# Patient Record
Sex: Female | Born: 1960 | Race: White | Hispanic: No | Marital: Married | Smoking: Never smoker
Health system: Southern US, Community
[De-identification: ages and names within clinical notes are randomized; demographics above are authoritative.]

## PROBLEM LIST (undated history)

## (undated) ENCOUNTER — Encounter

## (undated) ENCOUNTER — Ambulatory Visit

## (undated) ENCOUNTER — Telehealth

## (undated) ENCOUNTER — Encounter
Attending: Student in an Organized Health Care Education/Training Program | Primary: Student in an Organized Health Care Education/Training Program

## (undated) ENCOUNTER — Non-Acute Institutional Stay: Payer: BLUE CROSS/BLUE SHIELD | Attending: Dermatology | Primary: Dermatology

## (undated) ENCOUNTER — Ambulatory Visit: Payer: BLUE CROSS/BLUE SHIELD

## (undated) DIAGNOSIS — L409 Psoriasis, unspecified: Secondary | ICD-10-CM

## (undated) DIAGNOSIS — U071 COVID-19: Secondary | ICD-10-CM

## (undated) DIAGNOSIS — T7840XA Allergy, unspecified, initial encounter: Secondary | ICD-10-CM

## (undated) DIAGNOSIS — N6009 Solitary cyst of unspecified breast: Secondary | ICD-10-CM

## (undated) DIAGNOSIS — N390 Urinary tract infection, site not specified: Secondary | ICD-10-CM

## (undated) DIAGNOSIS — F419 Anxiety disorder, unspecified: Secondary | ICD-10-CM

## (undated) DIAGNOSIS — F32A Depression, unspecified: Secondary | ICD-10-CM

## (undated) DIAGNOSIS — F329 Major depressive disorder, single episode, unspecified: Secondary | ICD-10-CM

## (undated) HISTORY — DX: Psoriasis, unspecified: L40.9

## (undated) HISTORY — DX: Urinary tract infection, site not specified: N39.0

## (undated) HISTORY — PX: BREAST BIOPSY: SHX20

## (undated) HISTORY — DX: Anxiety disorder, unspecified: F41.9

## (undated) HISTORY — DX: Allergy, unspecified, initial encounter: T78.40XA

## (undated) HISTORY — DX: Solitary cyst of unspecified breast: N60.09

## (undated) HISTORY — DX: Depression, unspecified: F32.A

## (undated) HISTORY — DX: COVID-19: U07.1

---
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
## 2004-10-31 ENCOUNTER — Ambulatory Visit: Payer: Self-pay | Admitting: Family Medicine

## 2006-09-03 ENCOUNTER — Ambulatory Visit: Payer: Self-pay | Admitting: Family Medicine

## 2008-10-20 HISTORY — PX: BREAST CYST ASPIRATION: SHX578

## 2008-12-13 ENCOUNTER — Ambulatory Visit: Payer: Self-pay | Admitting: Family Medicine

## 2013-01-03 ENCOUNTER — Ambulatory Visit: Payer: Self-pay

## 2013-03-04 ENCOUNTER — Ambulatory Visit: Payer: Self-pay | Admitting: Unknown Physician Specialty

## 2013-03-07 LAB — PATHOLOGY REPORT

## 2014-04-19 DIAGNOSIS — J0110 Acute frontal sinusitis, unspecified: Secondary | ICD-10-CM | POA: Insufficient documentation

## 2015-02-02 ENCOUNTER — Ambulatory Visit
Admit: 2015-02-02 | Disposition: A | Discharge status: Home / Self Care | Payer: Self-pay | Attending: Internal Medicine | Admitting: Internal Medicine

## 2016-11-27 ENCOUNTER — Other Ambulatory Visit: Payer: Self-pay | Admitting: Internal Medicine

## 2016-11-27 DIAGNOSIS — Z780 Asymptomatic menopausal state: Secondary | ICD-10-CM | POA: Insufficient documentation

## 2016-11-27 DIAGNOSIS — Z1239 Encounter for other screening for malignant neoplasm of breast: Secondary | ICD-10-CM

## 2016-12-26 ENCOUNTER — Ambulatory Visit
Admission: RE | Admit: 2016-12-26 | Discharge: 2016-12-26 | Disposition: A | Discharge status: Home / Self Care | Payer: Managed Care, Other (non HMO) | Source: Ambulatory Visit | Attending: Internal Medicine | Admitting: Internal Medicine

## 2016-12-26 DIAGNOSIS — Z1231 Encounter for screening mammogram for malignant neoplasm of breast: Secondary | ICD-10-CM | POA: Insufficient documentation

## 2016-12-26 DIAGNOSIS — Z1239 Encounter for other screening for malignant neoplasm of breast: Secondary | ICD-10-CM

## 2017-08-20 ENCOUNTER — Ambulatory Visit
Admission: RE | Admit: 2017-08-20 | Discharge: 2017-08-20 | Disposition: A | Discharge status: Home / Self Care | Payer: PRIVATE HEALTH INSURANCE

## 2017-08-20 DIAGNOSIS — L409 Psoriasis, unspecified: Principal | ICD-10-CM

## 2017-08-20 DIAGNOSIS — Z79899 Other long term (current) drug therapy: Secondary | ICD-10-CM

## 2017-08-28 MED ORDER — SECUKINUMAB 150 MG/ML SUBCUTANEOUS SYRINGE
INJECTION | SUBCUTANEOUS | 0 refills | 0.00000 days | Status: CP
Start: 2017-08-28 — End: 2017-08-31

## 2017-08-28 MED ORDER — SECUKINUMAB 150 MG/ML SUBCUTANEOUS SYRINGE: Syringe | 0 refills | 0 days | Status: AC

## 2017-08-31 MED ORDER — SECUKINUMAB 150 MG/ML SUBCUTANEOUS SYRINGE
INJECTION | 0 refills | 0 days | Status: CP
Start: 2017-08-31 — End: 2017-09-08

## 2017-09-08 ENCOUNTER — Ambulatory Visit
Admission: RE | Admit: 2017-09-08 | Discharge: 2017-09-08 | Payer: PRIVATE HEALTH INSURANCE | Attending: Dermatology | Admitting: Dermatology

## 2017-09-08 DIAGNOSIS — L400 Psoriasis vulgaris: Secondary | ICD-10-CM

## 2017-09-08 DIAGNOSIS — L409 Psoriasis, unspecified: Principal | ICD-10-CM

## 2017-09-08 MED ORDER — TRIAMCINOLONE ACETONIDE 0.1 % TOPICAL CREAM
2 refills | 0 days | Status: CP
Start: 2017-09-08 — End: 2019-06-09

## 2017-09-08 MED ORDER — SECUKINUMAB 150 MG/ML SUBCUTANEOUS SYRINGE
INJECTION | 0 refills | 0 days | Status: CP
Start: 2017-09-08 — End: 2018-02-25

## 2017-09-14 ENCOUNTER — Ambulatory Visit
Admission: RE | Admit: 2017-09-14 | Discharge: 2017-09-14 | Payer: MEDICAID | Attending: Dermatology | Admitting: Dermatology

## 2017-09-14 DIAGNOSIS — L409 Psoriasis, unspecified: Principal | ICD-10-CM

## 2017-09-14 MED ORDER — CLOBETASOL 0.05 % TOPICAL OINTMENT
Freq: Two times a day (BID) | TOPICAL | 1 refills | 0.00000 days | Status: CP
Start: 2017-09-14 — End: 2017-10-06

## 2017-09-16 ENCOUNTER — Ambulatory Visit: Admission: RE | Admit: 2017-09-16 | Discharge: 2017-09-16 | Payer: PRIVATE HEALTH INSURANCE

## 2017-09-16 DIAGNOSIS — L409 Psoriasis, unspecified: Principal | ICD-10-CM

## 2017-09-23 ENCOUNTER — Ambulatory Visit: Admission: RE | Admit: 2017-09-23 | Discharge: 2017-09-23 | Payer: PRIVATE HEALTH INSURANCE

## 2017-09-23 DIAGNOSIS — L409 Psoriasis, unspecified: Principal | ICD-10-CM

## 2017-09-23 MED ORDER — ADALIMUMAB 40 MG/0.8 ML SUBCUTANEOUS PEN KIT
SUBCUTANEOUS | 1 refills | 0.00000 days | Status: CP
Start: 2017-09-23 — End: 2017-09-25

## 2017-09-23 MED ORDER — ADALIMUMAB 40 MG/0.8 ML SUBCUTANEOUS PEN KIT: each | 1 refills | 0 days | Status: AC

## 2017-09-25 MED ORDER — ADALIMUMAB 40 MG/0.8 ML SUBCUTANEOUS PEN KIT
SUBCUTANEOUS | 1 refills | 0.00000 days | Status: CP
Start: 2017-09-25 — End: 2018-10-28

## 2017-09-30 ENCOUNTER — Ambulatory Visit: Admission: RE | Admit: 2017-09-30 | Discharge: 2017-09-30 | Payer: PRIVATE HEALTH INSURANCE

## 2017-09-30 DIAGNOSIS — L409 Psoriasis, unspecified: Principal | ICD-10-CM

## 2017-10-01 ENCOUNTER — Ambulatory Visit: Admission: RE | Admit: 2017-10-01 | Discharge: 2017-10-01 | Payer: PRIVATE HEALTH INSURANCE

## 2017-10-01 DIAGNOSIS — L409 Psoriasis, unspecified: Principal | ICD-10-CM

## 2017-10-06 ENCOUNTER — Ambulatory Visit
Admission: RE | Admit: 2017-10-06 | Discharge: 2017-10-06 | Payer: PRIVATE HEALTH INSURANCE | Attending: Dermatology | Admitting: Dermatology

## 2017-10-06 DIAGNOSIS — L409 Psoriasis, unspecified: Principal | ICD-10-CM

## 2017-10-06 MED ORDER — CLOBETASOL 0.05 % TOPICAL OINTMENT
Freq: Two times a day (BID) | TOPICAL | 3 refills | 0.00000 days | Status: CP
Start: 2017-10-06 — End: 2018-10-06

## 2017-10-06 MED ORDER — TRIAMCINOLONE ACETONIDE 0.1 % TOPICAL OINTMENT
3 refills | 0 days | Status: CP
Start: 2017-10-06 — End: ?

## 2017-11-24 ENCOUNTER — Encounter
Admit: 2017-11-24 | Discharge: 2017-11-25 | Payer: BLUE CROSS/BLUE SHIELD | Attending: Dermatology | Primary: Dermatology

## 2017-11-24 DIAGNOSIS — L409 Psoriasis, unspecified: Principal | ICD-10-CM

## 2018-02-25 ENCOUNTER — Encounter: Admit: 2018-02-25 | Discharge: 2018-02-26 | Payer: BLUE CROSS/BLUE SHIELD

## 2018-02-25 DIAGNOSIS — L409 Psoriasis, unspecified: Principal | ICD-10-CM

## 2018-02-25 DIAGNOSIS — Z79899 Other long term (current) drug therapy: Secondary | ICD-10-CM

## 2018-03-26 ENCOUNTER — Other Ambulatory Visit: Payer: Self-pay | Admitting: Internal Medicine

## 2018-03-26 DIAGNOSIS — Z1231 Encounter for screening mammogram for malignant neoplasm of breast: Secondary | ICD-10-CM

## 2018-05-18 ENCOUNTER — Ambulatory Visit
Admission: RE | Admit: 2018-05-18 | Discharge: 2018-05-18 | Disposition: A | Discharge status: Home / Self Care | Payer: Managed Care, Other (non HMO) | Source: Ambulatory Visit | Attending: Internal Medicine | Admitting: Internal Medicine

## 2018-05-18 DIAGNOSIS — Z1231 Encounter for screening mammogram for malignant neoplasm of breast: Secondary | ICD-10-CM | POA: Diagnosis not present

## 2018-09-02 ENCOUNTER — Encounter: Admit: 2018-09-02 | Discharge: 2018-09-03 | Payer: BLUE CROSS/BLUE SHIELD

## 2018-09-02 DIAGNOSIS — R768 Other specified abnormal immunological findings in serum: Principal | ICD-10-CM

## 2018-09-02 DIAGNOSIS — L409 Psoriasis, unspecified: Secondary | ICD-10-CM

## 2018-09-02 DIAGNOSIS — Z79899 Other long term (current) drug therapy: Secondary | ICD-10-CM

## 2018-10-29 MED ORDER — ADALIMUMAB 40 MG/0.8 ML SUBCUTANEOUS PEN KIT
SUBCUTANEOUS | 1 refills | 0.00000 days | Status: CP
Start: 2018-10-29 — End: 2019-06-09

## 2018-12-22 ENCOUNTER — Other Ambulatory Visit: Payer: Self-pay | Admitting: Internal Medicine

## 2018-12-22 DIAGNOSIS — Z1231 Encounter for screening mammogram for malignant neoplasm of breast: Secondary | ICD-10-CM

## 2019-05-24 ENCOUNTER — Ambulatory Visit: Payer: Managed Care, Other (non HMO) | Admitting: Internal Medicine

## 2019-06-02 ENCOUNTER — Encounter: Payer: Self-pay | Admitting: Internal Medicine

## 2019-06-02 ENCOUNTER — Other Ambulatory Visit: Payer: Self-pay

## 2019-06-02 ENCOUNTER — Ambulatory Visit (INDEPENDENT_AMBULATORY_CARE_PROVIDER_SITE_OTHER): Payer: Managed Care, Other (non HMO) | Admitting: Internal Medicine

## 2019-06-02 VITALS — Ht 68.0 in | Wt 150.0 lb

## 2019-06-02 DIAGNOSIS — B009 Herpesviral infection, unspecified: Secondary | ICD-10-CM | POA: Insufficient documentation

## 2019-06-02 DIAGNOSIS — E559 Vitamin D deficiency, unspecified: Secondary | ICD-10-CM

## 2019-06-02 DIAGNOSIS — Z0000 Encounter for general adult medical examination without abnormal findings: Secondary | ICD-10-CM

## 2019-06-02 DIAGNOSIS — Z13818 Encounter for screening for other digestive system disorders: Secondary | ICD-10-CM

## 2019-06-02 DIAGNOSIS — Z1231 Encounter for screening mammogram for malignant neoplasm of breast: Secondary | ICD-10-CM

## 2019-06-02 DIAGNOSIS — F39 Unspecified mood [affective] disorder: Secondary | ICD-10-CM | POA: Insufficient documentation

## 2019-06-02 DIAGNOSIS — Z13220 Encounter for screening for lipoid disorders: Secondary | ICD-10-CM | POA: Diagnosis not present

## 2019-06-02 DIAGNOSIS — F419 Anxiety disorder, unspecified: Secondary | ICD-10-CM

## 2019-06-02 DIAGNOSIS — Z1329 Encounter for screening for other suspected endocrine disorder: Secondary | ICD-10-CM

## 2019-06-02 DIAGNOSIS — Z1159 Encounter for screening for other viral diseases: Secondary | ICD-10-CM

## 2019-06-02 DIAGNOSIS — L409 Psoriasis, unspecified: Secondary | ICD-10-CM | POA: Diagnosis not present

## 2019-06-02 DIAGNOSIS — Z1389 Encounter for screening for other disorder: Secondary | ICD-10-CM

## 2019-06-02 HISTORY — DX: Herpesviral infection, unspecified: B00.9

## 2019-06-02 MED ORDER — VALACYCLOVIR HCL 500 MG PO TABS: 500.0000 mg | ORAL_TABLET | Freq: Two times a day (BID) | ORAL | 3 refills | Status: DC

## 2019-06-02 NOTE — Progress Notes (Addendum)
Virtual Visit via Video Note  I connected with Jennifer MalkinMary Mcintosh  on 08/22/19 at 10:00 AM EDT by a video enabled telemedicine application and verified that I am speaking with the correct person using two identifiers.  Location patient: home Location provider:work or home office Persons participating in the virtual visit: patient, provider  I discussed the limitations of evaluation and management by telemedicine and the availability of in person appointments. The patient expressed understanding and agreed to proceed.   HPI: New patient  1. C/o anxiety related to speaking at church and also husband sick with salivary duct ca mets to liver since 11/2018 and bones, also younger 58 y.o son Jennifer Mcintosh in college at [employer] and 6th year in college   2. 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 Psoriasis appt upcoming dermatology Dr. Lysle MoralesJolley [facility] was on [place] did not like and MTX and Humira just stopped 2-6 weeks ago now coming back on legs and trunk but was better on Humira has had psa x 26 years    ROS: See pertinent positives and negatives per HPI. General: no wt changes  HEENT: no sore throat  CV: no chest pain  Lungs: no sob  GI: no ab pain  GU: no issues Skin: psoriasis  MSK: no jt pain  Neuro: no h/a Psych: anxiety+  Past Medical History:  Diagnosis Date  . Breast cyst    drained by Dr. Micah NoelLance in the past   . Psoriasis   . UTI (urinary tract infection)     Past Surgical History:  Procedure Laterality Date  . BREAST BIOPSY    . BREAST CYST ASPIRATION Right 2010   NEG  . CESAREAN SECTION     x 1     Family History  Problem Relation Age of Onset  . Dementia Mother        onset 5259 died 9574  . Gout Father   . Heart disease Father        MI age 859   . Obesity Brother   . Arthritis Brother        hip replacement   . Heart disease Son        ablation heart d/o   . Breast cancer Neg Hx     SOCIAL HX:  Married  2 sons    Current Outpatient Medications:  .  ALPRAZolam (XANAX)  0.25 MG tablet, Take 0.25 mg by mouth daily as needed for anxiety., Disp: , Rfl:  .  Multiple Vitamin (MULTIVITAMIN) tablet, Take 1 tablet by mouth daily., Disp: , Rfl:  .  valACYclovir (VALTREX) 500 MG tablet, Take 1 tablet (500 mg total) by mouth 2 (two) times daily. X 3-7 days prn outbreak, Disp: 90 tablet, Rfl: 3  EXAM:  VITALS per patient if applicable:  GENERAL: alert, oriented, appears well and in no acute distress  HEENT: atraumatic, conjunttiva clear, no obvious abnormalities on inspection of external nose and ears  NECK: normal movements of the head and neck  LUNGS: on inspection no signs of respiratory distress, breathing rate appears normal, no obvious gross SOB, gasping or wheezing  CV: no obvious cyanosis  MS: moves all visible extremities without noticeable abnormality  PSYCH/NEURO: pleasant and cooperative, no obvious depression or anxiety, speech and thought processing grossly intact  ASSESSMENT AND PLAN:  Discussed the following assessment and plan:  Psoriasis - Plan: f/u dermatology upcoming next week   Anxiety - Plan: prn xanax   HSV infection - Plan: prn valtrex  HM flu shot at work  Tdap 11/27/16 Pt wants to get shingrix in our office will contact   Mammogram referred 02/02/15 neg [place]  DEXA 12/03/16 normal  Colonoscopy 03/04/13 2 hyperplastic polyps f/u in 10 years  Pap due last in 01/12/2015 negative negative HPV due at f/u prev used premarin vaginal atrophy  Skin appt 06/09/19  Smoker 6 months in college   Xray L spine 12/29/00 negative  Former PCP Dr. Candiss Norse  [facility] needs to schedule for 2020  Dermatology Dr. Waldo Laine [place] on clobetasol 0.05% spray, [place], Humira for psoriasis in the past Denies falls or depression    I discussed the assessment and treatment plan with the patient. The patient was provided an opportunity to ask questions and all were answered. The patient agreed with the plan and demonstrated an understanding  of the instructions.   The patient was advised to call back or seek an in-person evaluation if the symptoms worsen or if the condition fails to improve as anticipated.  Time spent 30 minutes  Delorise Jackson, MD

## 2019-06-03 ENCOUNTER — Telehealth: Payer: Self-pay | Admitting: Internal Medicine

## 2019-06-03 NOTE — Telephone Encounter (Signed)
I called pt and left vm for pt to call ofc to sch Return in about 3 months (around 09/02/2019) for 3 months pap and shingrix

## 2019-06-03 NOTE — Telephone Encounter (Signed)
Pt called back , office was on other calls

## 2019-06-06 NOTE — Telephone Encounter (Signed)
I called pt and left vm for pt to call ofc to sch return in about 3 month (around 09/02/2019) for 3 months pap and shingrix.

## 2019-06-07 ENCOUNTER — Telehealth: Payer: Self-pay | Admitting: *Deleted

## 2019-06-07 NOTE — Telephone Encounter (Signed)
Copied from [place] [id number]. Topic: General - Inquiry >> Jun 07, 2019  3:12 PM Richardo Priest, [place] wrote: Reason for CRM: Patient called in and is wanting office to fax over lab results to dermatologist. Fax number is [phone], attention Dr.Jolly. Phone number for office is [phone]. Please advise.

## 2019-06-08 LAB — COMPREHENSIVE METABOLIC PANEL
ALT: 10 IU/L (ref 0–32)
AST: 18 IU/L (ref 0–40)
Albumin/Globulin Ratio: 1.8 (ref 1.2–2.2)
Albumin: 4.2 g/dL (ref 3.8–4.9)
Alkaline Phosphatase: 56 IU/L (ref 39–117)
BUN/Creatinine Ratio: 15 (ref 9–23)
BUN: 14 mg/dL (ref 6–24)
Bilirubin Total: 0.6 mg/dL (ref 0.0–1.2)
CO2: 26 mmol/L (ref 20–29)
Calcium: 9.2 mg/dL (ref 8.7–10.2)
Chloride: 101 mmol/L (ref 96–106)
Creatinine, Ser: 0.91 mg/dL (ref 0.57–1.00)
GFR calc Af Amer: 80 mL/min/{1.73_m2} (ref >59)
GFR calc non Af Amer: 70 mL/min/{1.73_m2} (ref >59)
Globulin, Total: 2.3 g/dL (ref 1.5–4.5)
Glucose: 87 mg/dL (ref 65–99)
Potassium: 4 mmol/L (ref 3.5–5.2)
Sodium: 140 mmol/L (ref 134–144)
Total Protein: 6.5 g/dL (ref 6.0–8.5)

## 2019-06-08 LAB — TSH: TSH: 1.38 u[IU]/mL (ref 0.450–4.500)

## 2019-06-08 LAB — CBC WITH DIFFERENTIAL/PLATELET
Basophils Absolute: 0.1 10*3/uL (ref 0.0–0.2)
Basos: 2 %
EOS (ABSOLUTE): 0.2 10*3/uL (ref 0.0–0.4)
Eos: 3 %
Hematocrit: 39.1 % (ref 34.0–46.6)
Hemoglobin: 13.5 g/dL (ref 11.1–15.9)
Immature Grans (Abs): 0 10*3/uL (ref 0.0–0.1)
Immature Granulocytes: 0 %
Lymphocytes Absolute: 1.8 10*3/uL (ref 0.7–3.1)
Lymphs: 40 %
MCH: 31.2 pg (ref 26.6–33.0)
MCHC: 34.5 g/dL (ref 31.5–35.7)
MCV: 90 fL (ref 79–97)
Monocytes Absolute: 0.4 10*3/uL (ref 0.1–0.9)
Monocytes: 9 %
Neutrophils Absolute: 2.1 10*3/uL (ref 1.4–7.0)
Neutrophils: 46 %
Platelets: 235 10*3/uL (ref 150–450)
RBC: 4.33 x10E6/uL (ref 3.77–5.28)
RDW: 11.9 % (ref 11.7–15.4)
WBC: 4.6 10*3/uL (ref 3.4–10.8)

## 2019-06-08 LAB — URINALYSIS, ROUTINE W REFLEX MICROSCOPIC
Bilirubin, UA: NEGATIVE
Glucose, UA: NEGATIVE
Ketones, UA: NEGATIVE
Leukocytes,UA: NEGATIVE
Nitrite, UA: NEGATIVE
Protein,UA: NEGATIVE
RBC, UA: NEGATIVE
Specific Gravity, UA: 1.009 (ref 1.005–1.030)
Urobilinogen, Ur: 0.2 mg/dL (ref 0.2–1.0)
pH, UA: 7 (ref 5.0–7.5)

## 2019-06-08 LAB — LIPID PANEL
Chol/HDL Ratio: 2.2 ratio (ref 0.0–4.4)
Cholesterol, Total: 185 mg/dL (ref 100–199)
HDL: 84 mg/dL (ref >39)
LDL Calculated: 91 mg/dL (ref 0–99)
Triglycerides: 50 mg/dL (ref 0–149)
VLDL Cholesterol Cal: 10 mg/dL (ref 5–40)

## 2019-06-08 LAB — VITAMIN D 25 HYDROXY (VIT D DEFICIENCY, FRACTURES): Vit D, 25-Hydroxy: 36.5 ng/mL (ref 30.0–100.0)

## 2019-06-08 LAB — HEPATITIS C ANTIBODY: Hep C Virus Ab: 0.1 s/co ratio (ref 0.0–0.9)

## 2019-06-08 NOTE — Telephone Encounter (Signed)
Ok please fax all labs to Dr. Marolyn Hammock we discussed this at her visit appt is Thursday 06/09/19   Thanks tMS

## 2019-06-08 NOTE — Telephone Encounter (Signed)
Info has been faxed.

## 2019-06-09 ENCOUNTER — Encounter: Admit: 2019-06-09 | Discharge: 2019-06-10 | Payer: PRIVATE HEALTH INSURANCE

## 2019-06-09 DIAGNOSIS — L409 Psoriasis, unspecified: Principal | ICD-10-CM

## 2019-06-09 DIAGNOSIS — L821 Other seborrheic keratosis: Secondary | ICD-10-CM

## 2019-06-09 DIAGNOSIS — D229 Melanocytic nevi, unspecified: Secondary | ICD-10-CM

## 2019-06-09 DIAGNOSIS — L578 Other skin changes due to chronic exposure to nonionizing radiation: Secondary | ICD-10-CM

## 2019-06-09 MED ORDER — COSENTYX PEN 300 MG/2 PENS (150 MG/ML) SUBCUTANEOUS
6 refills | 0 days | Status: CP
Start: 2019-06-09 — End: 2019-06-14

## 2019-06-14 MED ORDER — COSENTYX PEN 300 MG/2 PENS (150 MG/ML) SUBCUTANEOUS
6 refills | 0 days | Status: CP
Start: 2019-06-14 — End: ?

## 2019-06-30 ENCOUNTER — Encounter: Payer: Self-pay | Admitting: Internal Medicine

## 2019-07-21 ENCOUNTER — Ambulatory Visit
Admission: RE | Admit: 2019-07-21 | Discharge: 2019-07-21 | Disposition: A | Discharge status: Home / Self Care | Payer: Managed Care, Other (non HMO) | Source: Ambulatory Visit | Attending: Internal Medicine | Admitting: Internal Medicine

## 2019-07-21 DIAGNOSIS — Z1231 Encounter for screening mammogram for malignant neoplasm of breast: Secondary | ICD-10-CM

## 2019-08-22 ENCOUNTER — Encounter: Payer: Self-pay | Admitting: Internal Medicine

## 2019-09-01 ENCOUNTER — Other Ambulatory Visit: Payer: Self-pay

## 2019-09-02 ENCOUNTER — Encounter: Payer: Self-pay | Admitting: Internal Medicine

## 2019-09-02 ENCOUNTER — Ambulatory Visit (INDEPENDENT_AMBULATORY_CARE_PROVIDER_SITE_OTHER): Payer: Managed Care, Other (non HMO) | Admitting: Internal Medicine

## 2019-09-02 ENCOUNTER — Other Ambulatory Visit (HOSPITAL_COMMUNITY)
Admission: RE | Admit: 2019-09-02 | Discharge: 2019-09-02 | Disposition: A | Discharge status: Home / Self Care | Payer: Managed Care, Other (non HMO) | Source: Ambulatory Visit | Attending: Internal Medicine | Admitting: Internal Medicine

## 2019-09-02 VITALS — BP 120/70 | HR 63 | Temp 97.3°F | Ht 68.0 in | Wt 155.0 lb

## 2019-09-02 DIAGNOSIS — Z0000 Encounter for general adult medical examination without abnormal findings: Secondary | ICD-10-CM

## 2019-09-02 DIAGNOSIS — Z124 Encounter for screening for malignant neoplasm of cervix: Secondary | ICD-10-CM | POA: Diagnosis not present

## 2019-09-02 DIAGNOSIS — M25512 Pain in left shoulder: Secondary | ICD-10-CM

## 2019-09-02 DIAGNOSIS — M542 Cervicalgia: Secondary | ICD-10-CM | POA: Insufficient documentation

## 2019-09-02 DIAGNOSIS — K649 Unspecified hemorrhoids: Secondary | ICD-10-CM | POA: Insufficient documentation

## 2019-09-02 DIAGNOSIS — L409 Psoriasis, unspecified: Secondary | ICD-10-CM

## 2019-09-02 HISTORY — DX: Pain in left shoulder: M25.512

## 2019-09-02 HISTORY — DX: Unspecified hemorrhoids: K64.9

## 2019-09-02 HISTORY — DX: Encounter for general adult medical examination without abnormal findings: Z00.00

## 2019-09-02 MED ORDER — HYDROCORTISONE (PERIANAL) 2.5 % EX CREA: 1.0000 "application " | TOPICAL_CREAM | Freq: Two times a day (BID) | CUTANEOUS | 11 refills | Status: DC | PRN

## 2019-09-02 MED ORDER — DIBUCAINE (PERIANAL) 1 % EX OINT: 1.0000 "application " | TOPICAL_OINTMENT | CUTANEOUS | 11 refills | Status: DC | PRN

## 2019-09-02 NOTE — Patient Instructions (Addendum)
Colace 100-200 mg for hard stool   Ask Dr. Archie Balboa about shingrix ok and call to schedule nurse visit if wanted   Tylenol as needed for shoulder pain    Zoster Vaccine, Recombinant injection What is this medicine? ZOSTER VACCINE (ZOS ter vak SEEN) is used to prevent shingles in adults 59 years old and over. This vaccine is not used to treat shingles or nerve pain from shingles. This medicine may be used for other purposes; ask your health care provider or pharmacist if you have questions. COMMON BRAND NAME(S): [place] What should I tell my health care provider before I take this medicine? They need to know if you have any of these conditions:  blood disorders or disease  cancer like leukemia or lymphoma  immune system problems or therapy  an unusual or allergic reaction to vaccines, other medications, foods, dyes, or preservatives  pregnant or trying to get pregnant  breast-feeding How should I use this medicine? This vaccine is for injection in a muscle. It is given by a health care professional. Talk to your pediatrician regarding the use of this medicine in children. This medicine is not approved for use in children. Overdosage: If you think you have taken too much of this medicine contact a poison control center or emergency room at once. NOTE: This medicine is only for you. Do not share this medicine with others. What if I miss a dose? Keep appointments for follow-up (booster) doses as directed. It is important not to miss your dose. Call your doctor or health care professional if you are unable to keep an appointment. What may interact with this medicine?  medicines that suppress your immune system  medicines to treat cancer  steroid medicines like prednisone or cortisone This list may not describe all possible interactions. Give your health care provider a list of all the medicines, herbs, non-prescription drugs, or dietary supplements you use. Also tell them if you  smoke, drink alcohol, or use illegal drugs. Some items may interact with your medicine. What should I watch for while using this medicine? Visit your doctor for regular check ups. This vaccine, like all vaccines, may not fully protect everyone. What side effects may I notice from receiving this medicine? Side effects that you should report to your doctor or health care professional as soon as possible:  allergic reactions like skin rash, itching or hives, swelling of the face, lips, or tongue  breathing problems Side effects that usually do not require medical attention (report these to your doctor or health care professional if they continue or are bothersome):  chills  headache  fever  nausea, vomiting  redness, warmth, pain, swelling or itching at site where injected  tiredness This list may not describe all possible side effects. Call your doctor for medical advice about side effects. You may report side effects to FDA at 1-800-FDA-1088. Where should I keep my medicine? This vaccine is only given in a clinic, pharmacy, doctor's office, or other health care setting and will not be stored at home. NOTE: This sheet is a summary. It may not cover all possible information. If you have questions about this medicine, talk to your doctor, pharmacist, or health care provider.  2020 Elsevier/Gold Standard (2017-05-18 13:20:30)   Hemorrhoids Hemorrhoids are swollen veins in and around the rectum or anus. There are two types of hemorrhoids:  Internal hemorrhoids. These occur in the veins that are just inside the rectum. They may poke through to the outside and become irritated  and painful.  External hemorrhoids. These occur in the veins that are outside the anus and can be felt as a painful swelling or hard lump near the anus. Most hemorrhoids do not cause serious problems, and they can be managed with home treatments such as diet and lifestyle changes. If home treatments do not help the  symptoms, procedures can be done to shrink or remove the hemorrhoids. What are the causes? This condition is caused by increased pressure in the anal area. This pressure may result from various things, including:  Constipation.  Straining to have a bowel movement.  Diarrhea.  Pregnancy.  Obesity.  Sitting for long periods of time.  Heavy lifting or other activity that causes you to strain.  Anal sex.  Riding a bike for a long period of time. What are the signs or symptoms? Symptoms of this condition include:  Pain.  Anal itching or irritation.  Rectal bleeding.  Leakage of stool (feces).  Anal swelling.  One or more lumps around the anus. How is this diagnosed? This condition can often be diagnosed through a visual exam. Other exams or tests may also be done, such as:  An exam that involves feeling the rectal area with a gloved hand (digital rectal exam).  An exam of the anal canal that is done using a small tube (anoscope).  A blood test, if you have lost a significant amount of blood.  A test to look inside the colon using a flexible tube with a camera on the end (sigmoidoscopy or colonoscopy). How is this treated? This condition can usually be treated at home. However, various procedures may be done if dietary changes, lifestyle changes, and other home treatments do not help your symptoms. These procedures can help make the hemorrhoids smaller or remove them completely. Some of these procedures involve surgery, and others do not. Common procedures include:  Rubber band ligation. Rubber bands are placed at the base of the hemorrhoids to cut off their blood supply.  Sclerotherapy. Medicine is injected into the hemorrhoids to shrink them.  Infrared coagulation. A type of light energy is used to get rid of the hemorrhoids.  Hemorrhoidectomy surgery. The hemorrhoids are surgically removed, and the veins that supply them are tied off.  Stapled hemorrhoidopexy  surgery. The surgeon staples the base of the hemorrhoid to the rectal wall. Follow these instructions at home: Eating and drinking   Eat foods that have a lot of fiber in them, such as whole grains, beans, nuts, fruits, and vegetables.  Ask your health care provider about taking products that have added fiber (fiber supplements).  Reduce the amount of fat in your diet. You can do this by eating low-fat dairy products, eating less red meat, and avoiding processed foods.  Drink enough fluid to keep your urine pale yellow. Managing pain and swelling   Take warm sitz baths for 20 minutes, 3-4 times a day to ease pain and discomfort. You may do this in a bathtub or using a portable sitz bath that fits over the toilet.  If directed, apply ice to the affected area. Using ice packs between sitz baths may be helpful. ? Put ice in a plastic bag. ? Place a towel between your skin and the bag. ? Leave the ice on for 20 minutes, 2-3 times a day. General instructions  Take over-the-counter and prescription medicines only as told by your health care provider.  Use medicated creams or suppositories as told.  Get regular exercise. Ask your health  care provider how much and what kind of exercise is best for you. In general, you should do moderate exercise for at least 30 minutes on most days of the week (150 minutes each week). This can include activities such as walking, biking, or yoga.  Go to the bathroom when you have the urge to have a bowel movement. Do not wait.  Avoid straining to have bowel movements.  Keep the anal area dry and clean. Use wet toilet paper or moist towelettes after a bowel movement.  Do not sit on the toilet for long periods of time. This increases blood pooling and pain.  Keep all follow-up visits as told by your health care provider. This is important. Contact a health care provider if you have:  Increasing pain and swelling that are not controlled by treatment or  medicine.  Difficulty having a bowel movement, or you are unable to have a bowel movement.  Pain or inflammation outside the area of the hemorrhoids. Get help right away if you have:  Uncontrolled bleeding from your rectum. Summary  Hemorrhoids are swollen veins in and around the rectum or anus.  Most hemorrhoids can be managed with home treatments such as diet and lifestyle changes.  Taking warm sitz baths can help ease pain and discomfort.  In severe cases, procedures or surgery can be done to shrink or remove the hemorrhoids. This information is not intended to replace advice given to you by your health care provider. Make sure you discuss any questions you have with your health care provider. Document Released: 10/03/2000 Document Revised: 10/14/2018 Document Reviewed: 02/25/2018 Elsevier Patient Education  2020 Elsevier Inc.    Shoulder Pain Many things can cause shoulder pain, including:  An injury to the shoulder.  Overuse of the shoulder.  Arthritis. The source of the pain can be:  Inflammation.  An injury to the shoulder joint.  An injury to a tendon, ligament, or bone. Follow these instructions at home: Pay attention to changes in your symptoms. Let your health care provider know about them. Follow these instructions to relieve your pain. If you have a sling:  Wear the sling as told by your health care provider. Remove it only as told by your health care provider.  Loosen the sling if your fingers tingle, become numb, or turn cold and blue.  Keep the sling clean.  If the sling is not waterproof: ? Do not let it get wet. Remove it to shower or bathe.  Move your arm as little as possible, but keep your hand moving to prevent swelling. Managing pain, stiffness, and swelling   If directed, put ice on the painful area: ? Put ice in a plastic bag. ? Place a towel between your skin and the bag. ? Leave the ice on for 20 minutes, 2-3 times per day. Stop  applying ice if it does not help with the pain.  Squeeze a soft ball or a foam pad as much as possible. This helps to keep the shoulder from swelling. It also helps to strengthen the arm. General instructions  Take over-the-counter and prescription medicines only as told by your health care provider.  Keep all follow-up visits as told by your health care provider. This is important. Contact a health care provider if:  Your pain gets worse.  Your pain is not relieved with medicines.  New pain develops in your arm, hand, or fingers. Get help right away if:  Your arm, hand, or fingers: ? Tingle. ? Become  numb. ? Become swollen. ? Become painful. ? Turn white or blue. Summary  Shoulder pain can be caused by an injury, overuse, or arthritis.  Pay attention to changes in your symptoms. Let your health care provider know about them.  This condition may be treated with a sling, ice, and pain medicines.  Contact your health care provider if the pain gets worse or new pain develops. Get help right away if your arm, hand, or fingers tingle or become numb, swollen, or painful.  Keep all follow-up visits as told by your health care provider. This is important. This information is not intended to replace advice given to you by your health care provider. Make sure you discuss any questions you have with your health care provider. Document Released: 07/16/2005 Document Revised: 04/20/2018 Document Reviewed: 04/20/2018 Elsevier Patient Education  [place].   Shoulder Pain Many things can cause shoulder pain, including:  An injury.  Moving the shoulder in the same way again and again (overuse).  Joint pain (arthritis). Pain can come from:  Swelling and irritation (inflammation) of any part of the shoulder.  An injury to the shoulder joint.  An injury to: ? Tissues that connect muscle to bone (tendons). ? Tissues that connect bones to each other  (ligaments). ? Bones. Follow these instructions at home: Watch for changes in your symptoms. Let your doctor know about them. Follow these instructions to help with your pain. If you have a sling:  Wear the sling as told by your doctor. Remove it only as told by your doctor.  Loosen the sling if your fingers: ? Tingle. ? Become numb. ? Turn cold and blue.  Keep the sling clean.  If the sling is not waterproof: ? Do not let it get wet. ? Take the sling off when you shower or bathe. Managing pain, stiffness, and swelling   If told, put ice on the painful area: ? Put ice in a plastic bag. ? Place a towel between your skin and the bag. ? Leave the ice on for 20 minutes, 2-3 times a day. Stop putting ice on if it does not help with the pain.  Squeeze a soft ball or a foam pad as much as possible. This prevents swelling in the shoulder. It also helps to strengthen the arm. General instructions  Take over-the-counter and prescription medicines only as told by your doctor.  Keep all follow-up visits as told by your doctor. This is important. Contact a doctor if:  Your pain gets worse.  Medicine does not help your pain.  You have new pain in your arm, hand, or fingers. Get help right away if:  Your arm, hand, or fingers: ? Tingle. ? Are numb. ? Are swollen. ? Are painful. ? Turn white or blue. Summary  Shoulder pain can be caused by many things. These include injury, moving the shoulder in the same away again and again, and joint pain.  Watch for changes in your symptoms. Let your doctor know about them.  This condition may be treated with a sling, ice, and pain medicine.  Contact your doctor if the pain gets worse or you have new pain. Get help right away if your arm, hand, or fingers tingle or get numb, swollen, or painful.  Keep all follow-up visits as told by your doctor. This is important. This information is not intended to replace advice given to you by your  health care provider. Make sure you discuss any questions you have with  your health care provider. Document Released: 03/24/2008 Document Revised: 04/20/2018 Document Reviewed: 04/20/2018 Elsevier Patient Education  2020 [employer].

## 2019-09-02 NOTE — Progress Notes (Signed)
Chief Complaint  Patient presents with  . Gynecologic Exam   Annual  1. C/o left shoulder pain and pain with ROM esp reaching back 4-5/10 pain worse in the am and this happened after moved heavy piece of furniture 05/2019 she used to play tennis. Nothing tried.   2. Chronic hemorrhoids which flare at times and are painful otc preH is not helping   3. Mother died dementia age 59 y.o and she ready a study which psoriasis linked to memory oss which is her only concern   4. Psoriasis x 27 years not controlled since  switched from Humira due to photosensitivity to Cosenytx since 05/2019 but psoriasis looks worse as its ever looked but her pharmacy was behind on mailing her Rx so missed x 2 weeks of medication    Review of Systems  Constitutional: Negative for weight loss.  HENT: Negative for hearing loss.   Eyes: Negative for blurred vision.  Respiratory: Negative for shortness of breath.   Cardiovascular: Negative for chest pain.  Gastrointestinal: Negative for abdominal pain.  Musculoskeletal: Positive for joint pain.  Skin: Positive for rash.  Neurological: Negative for headaches.  Psychiatric/Behavioral: Negative for depression and memory loss.   Past Medical History:  Diagnosis Date  . Allergy    avoids antihistamines 2/2 intolerance   . Anxiety   . Breast cyst    drained by Dr. Micah Noel in the past   . Depression    took meds in 1998 x few months per Dr. Thedore Mins notes  . Psoriasis   . UTI (urinary tract infection)    Past Surgical History:  Procedure Laterality Date  . BREAST BIOPSY    . BREAST CYST ASPIRATION Right 2010   NEG  . CESAREAN SECTION     x 1    Family History  Problem Relation Age of Onset  . Dementia Mother        onset 91 died 67  . Alzheimer's disease Mother   . Hyperlipidemia Mother   . Gout Father   . Heart disease Father        MI age 54   . Other Father        gout  . Deep vein thrombosis Father   . Asthma Sister   . Obesity Brother   .  Arthritis Brother        hip replacement   . Heart disease Son        ablation heart d/o   . Asthma Son   . Breast cancer Neg Hx    Social History   Socioeconomic History  . Marital status: Married    Spouse name: Not on file  . Number of children: Not on file  . Years of education: Not on file  . Highest education level: Not on file  Occupational History  . Not on file  Social Needs  . Financial resource strain: Not on file  . Food insecurity    Worry: Not on file    Inability: Not on file  . Transportation needs    Medical: Not on file    Non-medical: Not on file  Tobacco Use  . Smoking status: Never Smoker  . Smokeless tobacco: Never Used  Substance and Sexual Activity  . Alcohol use: Yes    Comment: 2 wine glasses qhs  . Drug use: Not Currently  . Sexual activity: Not Currently  Lifestyle  . Physical activity    Days per week: Not on file    Minutes  per session: Not on file  . Stress: Not on file  Relationships  . Social Musician on phone: Not on file    Gets together: Not on file    Attends religious service: Not on file    Active member of club or organization: Not on file    Attends meetings of clubs or organizations: Not on file    Relationship status: Not on file  . Intimate partner violence    Fear of current or ex partner: Not on file    Emotionally abused: Not on file    Physically abused: Not on file    Forced sexual activity: Not on file  Other Topics Concern  . 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 ALPRAZolam (XANAX) 0.25 MG tablet Take 0.25 mg by mouth daily as needed for anxiety.  . Multiple Vitamin (MULTIVITAMIN) tablet Take 1 tablet  by mouth daily.  . Secukinumab, 300 MG Dose, (COSENTYX SENSOREADY, 300 MG,) 150 MG/ML SOAJ Inject the contents of 2 pens ( ) under the skin once weekly at weeks 0, 1, 2, 3, and 4, then inject 2 pens ( ) once every 4 weeks.  . valACYclovir (VALTREX) 500 MG tablet Take 1 tablet (500 mg total) by mouth 2 (two) times daily. X 3-7 days prn outbreak   No Known Allergies Recent Results (from the past 2160 hour(s))  Comprehensive metabolic panel     Status: None   Collection Time: 06/07/19  1:34 PM  Result Value Ref Range   Glucose 87 65 - 99 mg/dL   BUN 14 6 - 24 mg/dL   Creatinine, Ser 4.78 0.57 - 1.00 mg/dL   GFR calc non Af Amer 70 >59 mL/min/1.73   GFR calc Af Amer 80 >59 mL/min/1.73   BUN/Creatinine Ratio 15 9 - 23   Sodium 140 134 - 144 mmol/L   Potassium 4.0 3.5 - 5.2 mmol/L   Chloride 101 96 - 106 mmol/L   CO2 26 20 - 29 mmol/L   Calcium 9.2 8.7 - 10.2 mg/dL   Total Protein 6.5 6.0 - 8.5 g/dL   Albumin 4.2 3.8 - 4.9 g/dL   Globulin, Total 2.3 1.5 - 4.5 g/dL   Albumin/Globulin Ratio 1.8 1.2 - 2.2   Bilirubin Total 0.6 0.0 - 1.2 mg/dL   Alkaline Phosphatase 56 39 - 117 IU/L   AST 18 0 - 40 IU/L   ALT 10 0 - 32 IU/L  CBC w/Diff     Status: None   Collection Time: 06/07/19  1:34 PM  Result Value Ref Range   WBC 4.6 3.4 - 10.8 x10E3/uL   RBC 4.33 3.77 - 5.28 x10E6/uL   Hemoglobin 13.5 11.1 - 15.9 g/dL   Hematocrit 29.5 62.1 - 46.6 %   MCV 90 79 - 97 fL   MCH 31.2 26.6 - 33.0 pg   MCHC 34.5 31.5 - 35.7 g/dL   RDW 30.8 65.7 - 84.6 %   Platelets 235 150 - 450 x10E3/uL   Neutrophils 46 Not Estab. %  Lymphs 40 Not Estab. %   Monocytes 9 Not Estab. %   Eos 3 Not Estab. %   Basos 2 Not Estab. %   Neutrophils Absolute 2.1 1.4 - 7.0 x10E3/uL   Lymphocytes Absolute 1.8 0.7 - 3.1 x10E3/uL   Monocytes Absolute 0.4 0.1 - 0.9 x10E3/uL   EOS (ABSOLUTE) 0.2 0.0 - 0.4 x10E3/uL   Basophils Absolute 0.1 0.0 - 0.2 x10E3/uL   Immature Granulocytes 0 Not Estab. %   Immature Grans  (Abs) 0.0 0.0 - 0.1 x10E3/uL  Lipid panel     Status: None   Collection Time: 06/07/19  1:34 PM  Result Value Ref Range   Cholesterol, Total 185 100 - 199 mg/dL   Triglycerides 50 0 - 149 mg/dL   HDL 84 >62 mg/dL   VLDL Cholesterol Cal 10 5 - 40 mg/dL   LDL Calculated 91 0 - 99 mg/dL   Chol/HDL Ratio 2.2 0.0 - 4.4 ratio    Comment:                                   T. Chol/HDL Ratio                                             Men  Women                               1/2 Avg.Risk  3.4    3.3                                   Avg.Risk  5.0    4.4                                2X Avg.Risk  9.6    7.1                                3X Avg.Risk 23.4   11.0   TSH     Status: None   Collection Time: 06/07/19  1:34 PM  Result Value Ref Range   TSH 1.380 0.450 - 4.500 uIU/mL  Urinalysis, Routine w reflex microscopic     Status: None   Collection Time: 06/07/19  1:34 PM  Result Value Ref Range   Specific Gravity, UA 1.009 1.005 - 1.030   pH, UA 7.0 5.0 - 7.5   Color, UA Yellow Yellow   Appearance Ur Clear Clear   Leukocytes,UA Negative Negative   Protein,UA Negative Negative/Trace   Glucose, UA Negative Negative   Ketones, UA Negative Negative   RBC, UA Negative Negative   Bilirubin, UA Negative Negative   Urobilinogen, Ur 0.2 0.2 - 1.0 mg/dL   Nitrite, UA Negative Negative   Microscopic Examination Comment     Comment: Microscopic not indicated and not performed.  Vitamin D (25 hydroxy)     Status: None   Collection Time: 06/07/19  1:34 PM  Result Value Ref Range   Vit D, 25-Hydroxy 36.5 30.0 - 100.0 ng/mL    Comment: Vitamin D deficiency has been defined by  the Institute of Medicine and an Endocrine Society practice guideline as a level of serum 25-OH vitamin D less than 20 ng/mL (1,2). The Endocrine Society went on to further define vitamin D insufficiency as a level between 21 and 29 ng/mL (2). 1. IOM (Institute of Medicine). 2010. Dietary reference    intakes for calcium  and D. [place]: The    [employer]. 2. Holick MF, Binkley , Bischoff-Ferrari HA, et al.    Evaluation, treatment, and prevention of vitamin D    deficiency: an Endocrine Society clinical practice    guideline. JCEM. 2011 Jul; 96(7):1911-30.   Hepatitis C antibody     Status: None   Collection Time: 06/07/19  1:34 PM  Result Value Ref Range   Hep C Virus Ab 0.1 0.0 - 0.9 s/co ratio    Comment:                                   Negative:     < 0.8                              Indeterminate: 0.8 - 0.9                                   Positive:     > 0.9  The CDC recommends that a positive HCV antibody result  be followed up with a HCV Nucleic Acid Amplification  test ([id number]).    Objective  Body mass index is 23.57 kg/m. Wt Readings from Last 3 Encounters:  09/02/19 155 lb (70.3 kg)  06/02/19 150 lb (68 kg)   Temp Readings from Last 3 Encounters:  09/02/19 (!) 97.3 F (36.3 C) (Skin)   BP Readings from Last 3 Encounters:  09/02/19 120/70   Pulse Readings from Last 3 Encounters:  09/02/19 63    Physical Exam Vitals signs and nursing note reviewed. Exam conducted with a chaperone present.  Constitutional:      Appearance: Normal appearance. She is well-developed and well-groomed.     Comments: +mask on    HENT:     Head: Normocephalic and atraumatic.  Eyes:     Conjunctiva/sclera: Conjunctivae normal.     Pupils: Pupils are equal, round, and reactive to light.  Cardiovascular:     Rate and Rhythm: Normal rate and regular rhythm.     Heart sounds: Normal heart sounds. No murmur.  Pulmonary:     Effort: Pulmonary effort is normal.     Breath sounds: Normal breath sounds.  Chest:     Chest wall: No mass.     Breasts: Breasts are symmetrical.        Right: Normal. No swelling, bleeding, inverted nipple, mass, nipple discharge, skin change or tenderness.        Left: Normal. No swelling, bleeding, inverted nipple, mass, nipple discharge, skin  change or tenderness.     Comments: Dense breasts  Abdominal:     General: Abdomen is flat. Bowel sounds are normal.     Tenderness: There is no abdominal tenderness.  Genitourinary:    Exam position: Supine.     Pubic Area: No rash.      Labia:        Right: No rash.        Left:  No rash.      Vagina: Normal.     Cervix: No cervical motion tenderness, discharge or friability.     Uterus: Normal.      Adnexa: Right adnexa normal and left adnexa normal.       Right: No mass, tenderness or fullness.         Left: No mass, tenderness or fullness.       Musculoskeletal:     Left shoulder: She exhibits decreased range of motion. She exhibits no tenderness.     Comments: Pain with ROM    Lymphadenopathy:     Upper Body:     Right upper body: No axillary adenopathy.     Left upper body: No axillary adenopathy.  Skin:    General: Skin is warm and dry.  Neurological:     General: No focal deficit present.     Mental Status: She is alert and oriented to person, place, and time. Mental status is at baseline.     Gait: Gait normal.  Psychiatric:        Attention and Perception: Attention and perception normal.        Mood and Affect: Mood and affect normal.        Speech: Speech normal.        Behavior: Behavior normal. Behavior is cooperative.        Thought Content: Thought content normal.        Cognition and Memory: Cognition and memory normal.        Judgment: Judgment normal.     Assessment  Plan  Annual physical exam flu shot utd  Tdap 11/27/16 Pt wants to get shingrix in our office disc with pt disc with [facility] Dr. Marolyn Hammock to see if ok with being on cosenyntex   Mammogram 07/21/2019 and breast exam  DEXA 12/03/16 normal  Colonoscopy 03/04/13 2 hyperplastic polyps f/u in 10 years  Pap due last in 01/12/2015 negative negative HPV  -prev used premarin vaginal atrophy  -h/o abnormal pap remotely  -pap today   Skin appt 06/09/19 Dr. Waldo Laine Smoker 6 months in college    Xray L spine 12/29/00 negative  Former PCP Dr. Candiss Norse  [facility] needs to schedule for 2020  Dermatology Dr. Waldo Laine [place] on clobetasol 0.05% spray, [place], Humira for psoriasis in the past Denies falls or depression  rec healthy diet and exercise   Cervicalgia - Plan: DG Cervical Spine Complete Acute pain of left shoulder - Plan: DG Shoulder Left  Hemorrhoids, unspecified hemorrhoid type - Plan: hydrocortisone (ANUSOL-HC) 2.5 % rectal cream, dibucaine (NUPERCAINAL) 1 % OINT  Psoriasis -f/u [facility] Dr. Marolyn Hammock   Provider: Dr. Olivia Mackie McLean-Scocuzza-Internal Medicine

## 2019-09-07 LAB — CYTOLOGY - PAP
Comment: NEGATIVE
Diagnosis: NEGATIVE
High risk HPV: NEGATIVE

## 2019-09-13 ENCOUNTER — Encounter: Payer: Self-pay | Admitting: Internal Medicine

## 2019-11-18 DIAGNOSIS — L409 Psoriasis, unspecified: Principal | ICD-10-CM

## 2019-11-18 MED ORDER — SKYRIZI 150 MG/1.66 ML(75 MG/0.83 ML X 2) SUBCUTANEOUS SYRINGE KIT
SUBCUTANEOUS | 1 refills | 0.00000 days | Status: CP
Start: 2019-11-18 — End: ?

## 2019-11-21 DIAGNOSIS — L409 Psoriasis, unspecified: Principal | ICD-10-CM

## 2019-11-21 MED ORDER — SKYRIZI 150 MG/1.66 ML(75 MG/0.83 ML X 2) SUBCUTANEOUS SYRINGE KIT
SUBCUTANEOUS | 1 refills | 0.00000 days | Status: CP
Start: 2019-11-21 — End: ?

## 2020-02-09 ENCOUNTER — Ambulatory Visit: Payer: Managed Care, Other (non HMO) | Admitting: Internal Medicine

## 2020-02-09 ENCOUNTER — Encounter: Payer: Self-pay | Admitting: Internal Medicine

## 2020-02-09 ENCOUNTER — Ambulatory Visit (INDEPENDENT_AMBULATORY_CARE_PROVIDER_SITE_OTHER): Payer: Managed Care, Other (non HMO)

## 2020-02-09 ENCOUNTER — Other Ambulatory Visit: Payer: Self-pay

## 2020-02-09 ENCOUNTER — Telehealth: Payer: Self-pay | Admitting: Internal Medicine

## 2020-02-09 VITALS — BP 110/74 | HR 84 | Temp 97.4°F | Ht 68.0 in | Wt 156.0 lb

## 2020-02-09 DIAGNOSIS — M25571 Pain in right ankle and joints of right foot: Secondary | ICD-10-CM

## 2020-02-09 DIAGNOSIS — Z13220 Encounter for screening for lipoid disorders: Secondary | ICD-10-CM | POA: Diagnosis not present

## 2020-02-09 DIAGNOSIS — G8929 Other chronic pain: Secondary | ICD-10-CM | POA: Insufficient documentation

## 2020-02-09 DIAGNOSIS — I493 Ventricular premature depolarization: Secondary | ICD-10-CM | POA: Diagnosis not present

## 2020-02-09 DIAGNOSIS — Z0000 Encounter for general adult medical examination without abnormal findings: Secondary | ICD-10-CM

## 2020-02-09 DIAGNOSIS — Z1329 Encounter for screening for other suspected endocrine disorder: Secondary | ICD-10-CM

## 2020-02-09 DIAGNOSIS — Z1389 Encounter for screening for other disorder: Secondary | ICD-10-CM

## 2020-02-09 HISTORY — DX: Ventricular premature depolarization: I49.3
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
## 2020-02-09 NOTE — Telephone Encounter (Signed)
Please request records and imaging [facility]   Thanks tMS

## 2020-02-09 NOTE — Patient Instructions (Addendum)
Petra Kuba wise or Petra Kuba Made Tumeric/ginger/Curcumin supplement   Dr. Laymond Purser podiatry  Dr. Milinda Pointer   06/09/2019 Office Visit [facility]  [address]  [place], [place] [address]  [phone]  Tyson Alias, [place]  [place]  [place], [place] [address]  [phone]  [phone] (Fax)  Psoriasis (Primary Dx);  Actinic skin damage;  Seborrheic keratoses;  Multiple benign nevi   Social History - documented as of this encounter    Premature Ventricular Contraction  A premature ventricular contraction (PVC) is a common kind of irregular heartbeat (arrhythmia). These contractions are extra heartbeats that start in the ventricles of the heart and occur too early in the normal sequence. During the PVC, the heart's normal electrical pathway is not used, so the beat is shorter and less effective. In most cases, these contractions come and go and do not require treatment. What are the causes? Common causes of the condition include:  Smoking.  Drinking alcohol.  Certain medicines.  Some illegal drugs.  Stress.  Caffeine. Certain medical conditions can also cause PVCs:  Heart failure.  Heart attack, or coronary artery disease.  Heart valve problems.  Changes in minerals in the blood (electrolytes).  Low blood oxygen levels or high carbon dioxide levels. In many cases, the cause of this condition is not known. What are the signs or symptoms? The main symptom of this condition is fast or skipped heartbeats (palpitations). Other symptoms include:  Chest pain.  Shortness of breath.  Feeling tired.  Dizziness.  Difficulty exercising. In some cases, there are no symptoms. How is this diagnosed? This condition may be diagnosed based on:  Your medical history.  A physical exam. During the exam, the health care provider will check for irregular heartbeats.  Tests, such as: ? An ECG (electrocardiogram) to monitor  the electrical activity of your heart. ? An ambulatory cardiac monitor. This device records your heartbeats for 24 hours or more. ? Stress tests to see how exercise affects your heart rhythm and blood supply. ? An echocardiogram. This test uses sound waves (ultrasound) to produce an image of your heart. ? An electrophysiology study (EPS). This test checks for electrical problems in your heart. How is this treated? Treatment for this condition depends on any underlying conditions, the type of PVCs that you are having, and how much the symptoms are interfering with your daily life. Possible treatments include:  Avoiding things that cause premature contractions (triggers). These include caffeine and alcohol.  Taking medicines if symptoms are severe or if the extra heartbeats are frequent.  Getting treatment for underlying conditions that cause PVCs.  Having an implantable cardioverter defibrillator (ICD), if you are at risk for a serious arrhythmia. The ICD is a small device that is inserted into your chest to monitor your heartbeat. When it senses an irregular heartbeat, it sends a shock to bring the heartbeat back to normal.  Having a procedure to destroy the portion of the heart tissue that sends out abnormal signals (catheter ablation). In some cases, no treatment is required. Follow these instructions at home: Lifestyle  Do not use any products that contain nicotine or tobacco, such as cigarettes, e-cigarettes, and chewing tobacco. If you need help quitting, ask your health care provider.  Do not use illegal drugs.  Exercise regularly. Ask your health care provider what type of exercise is safe for you.  Try to get at least 7-9 hours of sleep each night, or as much as recommended by  your health care provider.  Find healthy ways to manage stress. Avoid stressful situations when possible. Alcohol use  Do not drink alcohol if: ? Your health care provider tells you not to  drink. ? You are pregnant, may be pregnant, or are planning to become pregnant. ? Alcohol triggers your episodes.  If you drink alcohol: ? Limit how much you use to:  0-1 drink a day for women.  0-2 drinks a day for men.  Be aware of how much alcohol is in your drink. In the [place] one drink equals one 12 oz bottle of beer (355 mL), one 5 oz glass of wine (148 mL), or one 1 oz glass of hard liquor (44 mL). General instructions  Take over-the-counter and prescription medicines only as told by your health care provider.  If caffeine triggers episodes of PVC, do not eat, drink, or use anything with caffeine in it.  Keep all follow-up visits as told by your health care provider. This is important. Contact a health care provider if you:  Feel palpitations. Get help right away if you:  Have chest pain.  Have shortness of breath.  Have sweating for no reason.  Have nausea and vomiting.  Become light-headed or you faint. Summary  A premature ventricular contraction (PVC) is a common kind of irregular heartbeat (arrhythmia).  In most cases, these contractions come and go and do not require treatment.  You may need to wear an ambulatory cardiac monitor. This records your heartbeats for 24 hours or more.  Treatment depends on any underlying conditions, the type of PVCs that you are having, and how much the symptoms are interfering with your daily life. This information is not intended to replace advice given to you by your health care provider. Make sure you discuss any questions you have with your health care provider. Document Revised: 07/01/2018 Document Reviewed: 07/01/2018 Elsevier Patient Education  2020 [employer]  Shoulder Range of Motion Exercises Shoulder range of motion (ROM) exercises are done to keep the shoulder moving freely or to increase movement. They are often recommended for people who have shoulder pain or stiffness or who are recovering from a shoulder  surgery. Phase 1 exercises When you are able, do this exercise 1-2 times per day for 30-60 seconds in each direction, or as directed by your health care provider. Pendulum exercise To do this exercise while sitting: 1. Sit in a chair or at the edge of your bed with your feet flat on the floor. 2. Let your affected arm hang down in front of you over the edge of the bed or chair. 3. Relax your shoulder, arm, and hand. 4. Rock your body so your arm gently swings in small circles. You can also use your unaffected arm to start the motion. 5. Repeat changing the direction of the circles, swinging your arm left and right, and swinging your arm forward and back. To do this exercise while standing: 1. Stand next to a sturdy chair or table, and hold on to it with your hand on your unaffected side. 2. Bend forward at the waist. 3. Bend your knees slightly. 4. Relax your shoulder, arm, and hand. 5. While keeping your shoulder relaxed, use body motion to swing your arm in small circles. 6. Repeat changing the direction of the circles, swinging your arm left and right, and swinging your arm forward and back. 7. Between exercises, stand up tall and take a short break to relax your lower back.  Phase 2  exercises Do these exercises 1-2 times per day or as told by your health care provider. Hold each stretch for 30 seconds, and repeat 3 times. Do the exercises with one or both arms as instructed by your health care provider. For these exercises, sit at a table with your hand and arm supported by the table. A chair that slides easily or has wheels can be helpful. External rotation 1. Turn your chair so that your affected side is nearest to the table. 2. Place your forearm on the table to your side. Bend your elbow about 90 at the elbow (right angle) and place your hand palm facing down on the table. Your elbow should be about 6 inches away from your side. 3. Keeping your arm on the table, lean your body  forward. Abduction 1. Turn your chair so that your affected side is nearest to the table. 2. Place your forearm and hand on the table so that your thumb points toward the ceiling and your arm is straight out to your side. 3. Slide your hand out to the side and away from you, using your unaffected arm to do the work. 4. To increase the stretch, you can slide your chair away from the table. Flexion: forward stretch 1. Sit facing the table. Place your hand and elbow on the table in front of you. 2. Slide your hand forward and away from you, using your unaffected arm to do the work. 3. To increase the stretch, you can slide your chair backward. Phase 3 exercises Do these exercises 1-2 times per day or as told by your health care provider. Hold each stretch for 30 seconds, and repeat 3 times. Do the exercises with one or both arms as instructed by your health care provider. Cross-body stretch: posterior capsule stretch 1. Lift your arm straight out in front of you. 2. Bend your arm 90 at the elbow (right angle) so your forearm moves across your body. 3. Use your other arm to gently pull the elbow across your body, toward your other shoulder. Wall climbs 1. Stand with your affected arm extended out to the side with your hand resting on a door frame. 2. Slide your hand slowly up the door frame. 3. To increase the stretch, step through the door frame. Keep your body upright and do not lean. Wand exercises You will need a cane, a piece of PVC pipe, or a sturdy wooden dowel for wand exercises. Flexion To do this exercise while standing: 1. Hold the wand with both of your hands, palms down. 2. Using the other arm to help, lift your arms up and over your head, if able. 3. Push upward with your other arm to gently increase the stretch. To do this exercise while lying down: 1. Lie on your back with your elbows resting on the floor and the wand in both your hands. Your hands will be palm down, or  pointing toward your feet. 2. Lift your hands toward the ceiling, using your unaffected arm to help if needed. 3. Bring your arms overhead as able, using your unaffected arm to help if needed. Internal rotation 1. Stand while holding the wand behind you with both hands. Your unaffected arm should be extended above your head with the arm of the affected side extended behind you at the level of your waist. The wand should be pointing straight up and down as you hold it. 2. Slowly pull the wand up behind your back by straightening the elbow of  your unaffected arm and bending the elbow of your affected arm. External rotation 1. Lie on your back with your affected upper arm supported on a small pillow or rolled towel. When you first do this exercise, keep your upper arm close to your body. Over time, bring your arm up to a 90 angle out to the side. 2. Hold the wand across your stomach and with both hands palm up. Your elbow on your affected side should be bent at a 90 angle. 3. Use your unaffected side to help push your forearm away from you and toward the floor. Keep your elbow on your affected side bent at a 90 angle. Contact a health care provider if you have:  New or increasing pain.  New numbness, tingling, weakness, or discoloration in your arm or hand. This information is not intended to replace advice given to you by your health care provider. Make sure you discuss any questions you have with your health care provider. Document Revised: 11/18/2017 Document Reviewed: 11/18/2017 Elsevier Patient Education  2020 [employer]  Shoulder Exercises Ask your health care provider which exercises are safe for you. Do exercises exactly as told by your health care provider and adjust them as directed. It is normal to feel mild stretching, pulling, tightness, or discomfort as you do these exercises. Stop right away if you feel sudden pain or your pain gets worse. Do not begin these exercises until  told by your health care provider. Stretching exercises External rotation and abduction This exercise is sometimes called corner stretch. This exercise rotates your arm outward (external rotation) and moves your arm out from your body (abduction). 1. Stand in a doorway with one of your feet slightly in front of the other. This is called a staggered stance. If you cannot reach your forearms to the door frame, stand facing a corner of a room. 2. Choose one of the following positions as told by your health care provider: ? Place your hands and forearms on the door frame above your head. ? Place your hands and forearms on the door frame at the height of your head. ? Place your hands on the door frame at the height of your elbows. 3. Slowly move your weight onto your front foot until you feel a stretch across your chest and in the front of your shoulders. Keep your head and chest upright and keep your abdominal muscles tight. 4. Hold for __________ seconds. 5. To release the stretch, shift your weight to your back foot. Repeat __________ times. Complete this exercise __________ times a day. Extension, standing 1. Stand and hold a broomstick, a cane, or a similar object behind your back. ? Your hands should be a little wider than shoulder width apart. ? Your palms should face away from your back. 2. Keeping your elbows straight and your shoulder muscles relaxed, move the stick away from your body until you feel a stretch in your shoulders (extension). ? Avoid shrugging your shoulders while you move the stick. Keep your shoulder blades tucked down toward the middle of your back. 3. Hold for __________ seconds. 4. Slowly return to the starting position. Repeat __________ times. Complete this exercise __________ times a day. Range-of-motion exercises Pendulum  1. Stand near a wall or a surface that you can hold onto for balance. 2. Bend at the waist and let your left / right arm hang straight down.  Use your other arm to support you. Keep your back straight and do not lock your  knees. 3. Relax your left / right arm and shoulder muscles, and move your hips and your trunk so your left / right arm swings freely. Your arm should swing because of the motion of your body, not because you are using your arm or shoulder muscles. 4. Keep moving your hips and trunk so your arm swings in the following directions, as told by your health care provider: ? Side to side. ? Forward and backward. ? In clockwise and counterclockwise circles. 5. Continue each motion for __________ seconds, or for as long as told by your health care provider. 6. Slowly return to the starting position. Repeat __________ times. Complete this exercise __________ times a day. Shoulder flexion, standing  1. Stand and hold a broomstick, a cane, or a similar object. Place your hands a little more than shoulder width apart on the object. Your left / right hand should be palm up, and your other hand should be palm down. 2. Keep your elbow straight and your shoulder muscles relaxed. Push the stick up with your healthy arm to raise your left / right arm in front of your body, and then over your head until you feel a stretch in your shoulder (flexion). ? Avoid shrugging your shoulder while you raise your arm. Keep your shoulder blade tucked down toward the middle of your back. 3. Hold for __________ seconds. 4. Slowly return to the starting position. Repeat __________ times. Complete this exercise __________ times a day. Shoulder abduction, standing 1. Stand and hold a broomstick, a cane, or a similar object. Place your hands a little more than shoulder width apart on the object. Your left / right hand should be palm up, and your other hand should be palm down. 2. Keep your elbow straight and your shoulder muscles relaxed. Push the object across your body toward your left / right side. Raise your left / right arm to the side of your body  (abduction) until you feel a stretch in your shoulder. ? Do not raise your arm above shoulder height unless your health care provider tells you to do that. ? If directed, raise your arm over your head. ? Avoid shrugging your shoulder while you raise your arm. Keep your shoulder blade tucked down toward the middle of your back. 3. Hold for __________ seconds. 4. Slowly return to the starting position. Repeat __________ times. Complete this exercise __________ times a day. Internal rotation  1. Place your left / right hand behind your back, palm up. 2. Use your other hand to dangle an exercise band, a towel, or a similar object over your shoulder. Grasp the band with your left / right hand so you are holding on to both ends. 3. Gently pull up on the band until you feel a stretch in the front of your left / right shoulder. The movement of your arm toward the center of your body is called internal rotation. ? Avoid shrugging your shoulder while you raise your arm. Keep your shoulder blade tucked down toward the middle of your back. 4. Hold for __________ seconds. 5. Release the stretch by letting go of the band and lowering your hands. Repeat __________ times. Complete this exercise __________ times a day. Strengthening exercises External rotation  1. Sit in a stable chair without armrests. 2. Secure an exercise band to a stable object at elbow height on your left / right side. 3. Place a soft object, such as a folded towel or a small pillow, between your left /  right upper arm and your body to move your elbow about 4 inches (10 cm) away from your side. 4. Hold the end of the exercise band so it is tight and there is no slack. 5. Keeping your elbow pressed against the soft object, slowly move your forearm out, away from your abdomen (external rotation). Keep your body steady so only your forearm moves. 6. Hold for __________ seconds. 7. Slowly return to the starting position. Repeat __________  times. Complete this exercise __________ times a day. Shoulder abduction  1. Sit in a stable chair without armrests, or stand up. 2. Hold a __________ weight in your left / right hand, or hold an exercise band with both hands. 3. Start with your arms straight down and your left / right palm facing in, toward your body. 4. Slowly lift your left / right hand out to your side (abduction). Do not lift your hand above shoulder height unless your health care provider tells you that this is safe. ? Keep your arms straight. ? Avoid shrugging your shoulder while you do this movement. Keep your shoulder blade tucked down toward the middle of your back. 5. Hold for __________ seconds. 6. Slowly lower your arm, and return to the starting position. Repeat __________ times. Complete this exercise __________ times a day. Shoulder extension 1. Sit in a stable chair without armrests, or stand up. 2. Secure an exercise band to a stable object in front of you so it is at shoulder height. 3. Hold one end of the exercise band in each hand. Your palms should face each other. 4. Straighten your elbows and lift your hands up to shoulder height. 5. Step back, away from the secured end of the exercise band, until the band is tight and there is no slack. 6. Squeeze your shoulder blades together as you pull your hands down to the sides of your thighs (extension). Stop when your hands are straight down by your sides. Do not let your hands go behind your body. 7. Hold for __________ seconds. 8. Slowly return to the starting position. Repeat __________ times. Complete this exercise __________ times a day. Shoulder row 1. Sit in a stable chair without armrests, or stand up. 2. Secure an exercise band to a stable object in front of you so it is at waist height. 3. Hold one end of the exercise band in each hand. Position your palms so that your thumbs are facing the ceiling (neutral position). 4. Bend each of your elbows to  a 90-degree angle (right angle) and keep your upper arms at your sides. 5. Step back until the band is tight and there is no slack. 6. Slowly pull your elbows back behind you. 7. Hold for __________ seconds. 8. Slowly return to the starting position. Repeat __________ times. Complete this exercise __________ times a day. Shoulder press-ups  1. Sit in a stable chair that has armrests. Sit upright, with your feet flat on the floor. 2. Put your hands on the armrests so your elbows are bent and your fingers are pointing forward. Your hands should be about even with the sides of your body. 3. Push down on the armrests and use your arms to lift yourself off the chair. Straighten your elbows and lift yourself up as much as you comfortably can. ? Move your shoulder blades down, and avoid letting your shoulders move up toward your ears. ? Keep your feet on the ground. As you get stronger, your feet should support less of  your body weight as you lift yourself up. 4. Hold for __________ seconds. 5. Slowly lower yourself back into the chair. Repeat __________ times. Complete this exercise __________ times a day. Wall push-ups  1. Stand so you are facing a stable wall. Your feet should be about one arm-length away from the wall. 2. Lean forward and place your palms on the wall at shoulder height. 3. Keep your feet flat on the floor as you bend your elbows and lean forward toward the wall. 4. Hold for __________ seconds. 5. Straighten your elbows to push yourself back to the starting position. Repeat __________ times. Complete this exercise __________ times a day. This information is not intended to replace advice given to you by your health care provider. Make sure you discuss any questions you have with your health care provider. Document Revised: 01/28/2019 Document Reviewed: 11/05/2018 Elsevier Patient Education  2020 [employer]

## 2020-02-10 NOTE — Telephone Encounter (Signed)
Sent to fax.

## 2020-02-10 NOTE — Telephone Encounter (Signed)
Sent faxes to [facility] for all medical records [phone] Record request was done on a fax cover sheet

## 2020-02-20 NOTE — Telephone Encounter (Signed)
I dont think so can you try to resend ROI (release of info)?   Thanks [employer]

## 2020-02-20 NOTE — Telephone Encounter (Signed)
Have you received these records?

## 2020-02-20 NOTE — Telephone Encounter (Signed)
Refaxed  Phone: [phone]  Phone: [phone]  Fax: [phone]

## 2020-02-20 NOTE — Telephone Encounter (Signed)
Re-faxed request to [facility] on 02/20/20

## 2020-03-07 ENCOUNTER — Ambulatory Visit: Payer: Managed Care, Other (non HMO) | Admitting: Podiatry

## 2020-03-07 ENCOUNTER — Ambulatory Visit: Payer: Managed Care, Other (non HMO)

## 2020-03-07 ENCOUNTER — Encounter: Payer: Self-pay | Admitting: Podiatry

## 2020-03-07 ENCOUNTER — Other Ambulatory Visit: Payer: Self-pay

## 2020-03-07 DIAGNOSIS — S86312A Strain of muscle(s) and tendon(s) of peroneal muscle group at lower leg level, left leg, initial encounter: Secondary | ICD-10-CM

## 2020-03-07 NOTE — Progress Notes (Signed)
Subjective:  Patient ID: Jennifer Mcintosh, female    DOB: 1961/10/19,  MRN: [id number] HPI Chief Complaint  Patient presents with  . Ankle Pain    Patient presents today for ongoing right lateral ankle pain x 3 1/2 years.  She denies any injury but says that it has been swelling and there is a discomfort when walking.  Sometimes the pain is sharp, achy and burns.  She has taken Advil at times and used ice for relief    59 y.o. female presents with the above complaint.   ROS: Denies fever chills nausea vomiting muscle aches pains calf pain back pain chest pain shortness of breath.  She is having to care for her husband. Past Medical History:  Diagnosis Date  . Allergy    avoids antihistamines 2/2 intolerance   . Anxiety   . Breast cyst    drained by Dr. Mia Creek in the past   . Depression    took meds in 1998 x few months per Dr. Candiss Norse notes  . Psoriasis   . UTI (urinary tract infection)    Past Surgical History:  Procedure Laterality Date  . BREAST BIOPSY    . BREAST CYST ASPIRATION Right 2010   NEG  . CESAREAN SECTION     x 1     Current Outpatient Medications:  .  ALPRAZolam (XANAX) 0.25 MG tablet, Take 0.25 mg by mouth daily as needed for anxiety., Disp: , Rfl:  .  CALCIUM PO, Take by mouth., Disp: , Rfl:  .  Multiple Vitamin (MULTIVITAMIN) tablet, Take 1 tablet by mouth daily., Disp: , Rfl:  .  Secukinumab (COSENTYX SENSOREADY PEN) 150 MG/ML SOAJ, Cosentyx Pen 300 mg/2 Pens (150 mg/mL) subcutaneous, Disp: , Rfl:  .  valACYclovir (VALTREX) 500 MG tablet, Take 1 tablet (500 mg total) by mouth 2 (two) times daily. X 3-7 days prn outbreak, Disp: 90 tablet, Rfl: 3 .  VITAMIN E PO, Take by mouth., Disp: , Rfl:   No Known Allergies Review of Systems Objective:  There were no vitals filed for this visit.  General: Well developed, nourished, in no acute distress, alert and oriented x3   Dermatological: Skin is warm, dry and supple bilateral. Nails x 10 are well maintained;  remaining integument appears unremarkable at this time. There are no open sores, no preulcerative lesions, no rash or signs of infection present.  Vascular: Dorsalis Pedis artery and Posterior Tibial artery pedal pulses are 2/4 bilateral with immedate capillary fill time. Pedal hair growth present. No varicosities and no lower extremity edema present bilateral.   Neruologic: Grossly intact via light touch bilateral. Vibratory intact via tuning fork bilateral. Protective threshold with Semmes Wienstein monofilament intact to all pedal sites bilateral. Patellar and Achilles deep tendon reflexes 2+ bilateral. No Babinski or clonus noted bilateral.   Musculoskeletal: No gross boney pedal deformities bilateral. No pain, crepitus, or limitation noted with foot and ankle range of motion bilateral. Muscular strength 5/5 in all groups tested bilateral.  She has pain on palpation just inferior to the lateral malleolus.  She has swelling in this area and pain on palpation of the peroneal tendons and fluctuance within the tendon sheaths.  The majority of it is between the fibular groove and the peroneal tubercle.  Gait: Unassisted, Nonantalgic.    Radiographs:  Radiographs of the ankle were already in the computer made last month and I reviewed those today not demonstrate any type of ankle abnormalities at all.  Ankle joint is clean  the mortise appears to be normal.  No dislocations no spurring no osteoarthritic change.  Assessment & Plan:   Assessment: Peroneal tendinitis probable tear of the peroneal tendons  Plan: Requesting MRI for surgical reconstruction of the peroneal tendons.  This is been bothering her for about 3 and half years after repetitive injury.  Conservative therapies steroids anti-inflammatories and stability dressings have failed.     Max T. Clemmons, [place]

## 2020-03-08 ENCOUNTER — Telehealth: Payer: Self-pay

## 2020-03-08 NOTE — Telephone Encounter (Signed)
-----   Message from Kristian Covey, [facility] sent at 03/07/2020  2:54 PM EDT ----- Regarding: MRI MRI ankle left - suspect tear of peroneal tendons left - surgical consideration

## 2020-03-08 NOTE — Telephone Encounter (Signed)
Office note has been submitted to [facility] for further review

## 2020-03-12 ENCOUNTER — Telehealth: Payer: Self-pay

## 2020-03-12 DIAGNOSIS — S86312A Strain of muscle(s) and tendon(s) of peroneal muscle group at lower leg level, left leg, initial encounter: Secondary | ICD-10-CM

## 2020-03-12 NOTE — Telephone Encounter (Signed)
-----   Message from Ashley E Prevette, [facility] sent at 03/07/2020  2:54 PM EDT ----- Regarding: MRI MRI ankle left - suspect tear of peroneal tendons left - surgical consideration  

## 2020-03-12 NOTE — Telephone Encounter (Signed)
Per Rosann Auerbach, MRI has been approved from 03/08/2020 to 06/06/2020 Auth# [id number]  Patient has been notified of approval and will call scheduling to set up appt to her convenience.

## 2020-03-16 NOTE — Telephone Encounter (Signed)
Have you received these?

## 2020-03-16 NOTE — Telephone Encounter (Signed)
Yes - thank you

## 2020-03-27 ENCOUNTER — Encounter: Payer: Self-pay | Admitting: Podiatry

## 2020-03-29 ENCOUNTER — Ambulatory Visit
Admission: RE | Admit: 2020-03-29 | Discharge: 2020-03-29 | Disposition: A | Discharge status: Home / Self Care | Payer: Managed Care, Other (non HMO) | Source: Ambulatory Visit | Attending: Podiatry | Admitting: Podiatry

## 2020-03-29 ENCOUNTER — Other Ambulatory Visit: Payer: Self-pay

## 2020-03-29 DIAGNOSIS — S86312A Strain of muscle(s) and tendon(s) of peroneal muscle group at lower leg level, left leg, initial encounter: Secondary | ICD-10-CM

## 2020-04-05 ENCOUNTER — Telehealth: Payer: Self-pay

## 2020-04-05 NOTE — Telephone Encounter (Signed)
Patient notified of results via voice mail and instructed to call office to schedule surgical consult with Dr. Al Corpus.

## 2020-04-05 NOTE — Telephone Encounter (Signed)
-----   Message from Elinor Parkinson, [place] sent at 04/05/2020  7:58 AM EDT ----- Have her in for a surgical consult.

## 2020-04-19 ENCOUNTER — Encounter: Payer: Self-pay | Admitting: Podiatry

## 2020-04-19 ENCOUNTER — Ambulatory Visit (INDEPENDENT_AMBULATORY_CARE_PROVIDER_SITE_OTHER): Payer: BC Managed Care – PPO | Admitting: Podiatry

## 2020-04-19 ENCOUNTER — Other Ambulatory Visit: Payer: Self-pay

## 2020-04-19 DIAGNOSIS — S86312D Strain of muscle(s) and tendon(s) of peroneal muscle group at lower leg level, left leg, subsequent encounter: Secondary | ICD-10-CM | POA: Diagnosis not present

## 2020-04-19 NOTE — Progress Notes (Signed)
She presents today for follow-up of her MRI states that my foot is still just as bad as it was.  Objective: Vital signs are stable alert oriented x3.  Pulses are palpable.  Still has severe swelling and pain on palpation of the peroneal tendons as they course distal to the lateral malleolus extending to the arcuate portion of the cuboid and the fifth met base.  MRI does state that the peroneus brevis has considerable tendinosis with a small interstitial tear the peroneus longus has considerable tearing from its distal departure from the lateral malleolus to the arcuate portion of the cuboid.  Assessment: Peroneal tendinosis with peroneal tendon tears.  Plan: Discussed etiology pathology conservative versus surgical therapies I think at this point surgical therapy is necessary.  She has a husband that she is taking care of at home and this is going to be hard time for her however I do feel that if she does not do it soon that it may worsen and not be able to be healed and repaired easily.  At this point we consented her for repair peroneal tendons both lateral right.  We consented her and discussed possible postop complications which may include but not limited to postop pain bleeding swelling infection recurrence need for further surgery.  She understands that she is coming nonweightbearing for period of time she states that she would like to use crutches as opposed to knee scooter.  I will follow-up with her in the near future for surgical intervention. 

## 2020-04-20 ENCOUNTER — Other Ambulatory Visit: Payer: Self-pay | Admitting: Internal Medicine

## 2020-04-20 DIAGNOSIS — F419 Anxiety disorder, unspecified: Secondary | ICD-10-CM

## 2020-04-20 MED ORDER — ALPRAZOLAM 0.25 MG PO TABS: 0.2500 mg | ORAL_TABLET | Freq: Every day | ORAL | 5 refills | Status: DC | PRN

## 2020-05-09 ENCOUNTER — Encounter: Payer: Self-pay | Admitting: Internal Medicine

## 2020-05-09 ENCOUNTER — Telehealth: Payer: Self-pay | Admitting: Internal Medicine

## 2020-05-09 ENCOUNTER — Other Ambulatory Visit: Payer: Self-pay

## 2020-05-09 ENCOUNTER — Ambulatory Visit: Payer: BC Managed Care – PPO | Admitting: Internal Medicine

## 2020-05-09 ENCOUNTER — Telehealth: Payer: Self-pay | Admitting: Podiatry

## 2020-05-09 ENCOUNTER — Other Ambulatory Visit (HOSPITAL_COMMUNITY)
Admission: RE | Admit: 2020-05-09 | Discharge: 2020-05-09 | Disposition: A | Discharge status: Home / Self Care | Payer: BC Managed Care – PPO | Source: Ambulatory Visit | Attending: Internal Medicine | Admitting: Internal Medicine

## 2020-05-09 VITALS — BP 100/70 | HR 50 | Temp 98.3°F | Ht 68.0 in | Wt 152.8 lb

## 2020-05-09 DIAGNOSIS — N3000 Acute cystitis without hematuria: Secondary | ICD-10-CM | POA: Diagnosis not present

## 2020-05-09 DIAGNOSIS — B379 Candidiasis, unspecified: Secondary | ICD-10-CM | POA: Diagnosis not present

## 2020-05-09 DIAGNOSIS — Z1231 Encounter for screening mammogram for malignant neoplasm of breast: Secondary | ICD-10-CM

## 2020-05-09 DIAGNOSIS — R300 Dysuria: Secondary | ICD-10-CM

## 2020-05-09 DIAGNOSIS — N760 Acute vaginitis: Secondary | ICD-10-CM | POA: Diagnosis not present

## 2020-05-09 MED ORDER — CIPROFLOXACIN HCL 500 MG PO TABS: 500.0000 mg | ORAL_TABLET | Freq: Two times a day (BID) | ORAL | 0 refills | Status: DC

## 2020-05-09 MED ORDER — FLUCONAZOLE 150 MG PO TABS: 150.0000 mg | ORAL_TABLET | Freq: Once | ORAL | 0 refills | Status: AC

## 2020-05-09 NOTE — Telephone Encounter (Signed)
Called patient; patient scheduled cpe in Dec. 2021 and will call office back to schedule fasting labs. Patient was advised that her provider would like fasting labs done asap.

## 2020-05-09 NOTE — Telephone Encounter (Signed)
I put in a sameday on your schedule at 11:30 possible UTI today

## 2020-05-09 NOTE — Progress Notes (Signed)
Chief Complaint  Patient presents with  . Urinary Tract Infection   Acute  1. uti had intercourse with husband over the weekend and since Monday had burning with urination only sx. H/o uti in the past as a child. Worse sx's today nothing tried  2. Right ankle surgery repair sch Dr. Al Corpus 05/25/20    Review of Systems  Constitutional: Negative for weight loss.  HENT: Negative for hearing loss.   Eyes: Negative for blurred vision.  Respiratory: Negative for shortness of breath.   Cardiovascular: Negative for chest pain.  Gastrointestinal: Negative for abdominal pain.  Genitourinary: Positive for dysuria.  Musculoskeletal: Negative for falls.  Skin: Negative for rash.  Neurological: Negative for headaches.  Psychiatric/Behavioral: Negative for depression.   Past Medical History:  Diagnosis Date  . Allergy    avoids antihistamines 2/2 intolerance   . Anxiety   . Breast cyst    drained by Dr. Micah Noel in the past   . Depression    took meds in 1998 x few months per Dr. Thedore Mins notes  . Psoriasis   . UTI (urinary tract infection)    Past Surgical History:  Procedure Laterality Date  . BREAST BIOPSY    . BREAST CYST ASPIRATION Right 2010   NEG  . CESAREAN SECTION     x 1    Family History  Problem Relation Age of Onset  . Dementia Mother        onset 37 died 16  . Alzheimer's disease Mother   . Hyperlipidemia Mother   . Gout Father   . Heart disease Father        MI age 16   . Other Father        gout  . Deep vein thrombosis Father   . Asthma Sister   . Obesity Brother   . Arthritis Brother        hip replacement   . Heart disease Son        ablation heart d/o   . Asthma Son   . Breast cancer Neg Hx    Social History   Socioeconomic History  . Marital status: Married    Spouse name: Not on file  . Number of children: Not on file  . Years of education: Not on file  . Highest education level: Not on file  Occupational History  . Not on file  Tobacco Use  .  Smoking status: Never Smoker  . Smokeless tobacco: Never Used  Substance and Sexual Activity  . Alcohol use: Yes    Comment: 2 wine glasses qhs  . Drug use: Not Currently  . Sexual activity: Not Currently  Other Topics Concern  . 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 Difficulty of Paying Living Expenses:   Food Insecurity:   . Worried About Programme researcher, broadcasting/film/video in the Last Year:   . Barista in the Last Year:   Transportation Needs:   . Freight forwarder (Medical):   Marland Kitchen Lack of  Transportation (Non-Medical):   Physical Activity:   . Days of Exercise per Week:   . Minutes of Exercise per Session:   Stress:   . Feeling of Stress :   Social Connections:   . Frequency of Communication with Friends and Family:   . Frequency of Social Gatherings with Friends and Family:   . Attends Religious Services:   . Active Member of Clubs or Organizations:   . Attends Banker Meetings:   Marland Kitchen Marital Status:   Intimate Partner Violence:   . Fear of Current or Ex-Partner:   . Emotionally Abused:   Marland Kitchen Physically Abused:   . Sexually Abused:    Current Meds  Medication Sig  . ALPRAZolam (XANAX) 0.25 MG tablet Take 1 tablet (0.25 mg total) by mouth daily as needed for anxiety.  Marland Kitchen CALCIUM PO Take by mouth.  . Multiple Vitamin (MULTIVITAMIN) tablet Take 1 tablet by mouth daily.  . SKYRIZI, 150 MG DOSE, 75 MG/0.83ML PSKT   . valACYclovir (VALTREX) 500 MG tablet Take 1 tablet (500 mg total) by mouth 2 (two) times daily. X 3-7 days prn outbreak  . VITAMIN E PO Take by mouth.   No Known Allergies No results found for this or any previous visit (from  the past 2160 hour(s)). Objective  Body mass index is 23.23 kg/m. Wt Readings from Last 3 Encounters:  05/09/20 152 lb 12.8 oz (69.3 kg)  02/09/20 156 lb (70.8 kg)  09/02/19 155 lb (70.3 kg)   Temp Readings from Last 3 Encounters:  05/09/20 98.3 F (36.8 C) (Oral)  02/09/20 (!) 97.4 F (36.3 C) (Temporal)  09/02/19 (!) 97.3 F (36.3 C) (Skin)   BP Readings from Last 3 Encounters:  05/09/20 100/70  02/09/20 110/74  09/02/19 120/70   Pulse Readings from Last 3 Encounters:  05/09/20 (!) 50  02/09/20 84  09/02/19 63    Physical Exam Vitals and nursing note reviewed.  Constitutional:      Appearance: Normal appearance. She is well-developed and well-groomed.  HENT:     Head: Normocephalic and atraumatic.  Eyes:     Conjunctiva/sclera: Conjunctivae normal.     Pupils: Pupils are equal, round, and reactive to light.  Cardiovascular:     Rate and Rhythm: Normal rate and regular rhythm.     Heart sounds: Normal heart sounds. No murmur heard.   Pulmonary:     Effort: Pulmonary effort is normal.     Breath sounds: Normal breath sounds.  Abdominal:     General: Abdomen is flat. Bowel sounds are normal.     Tenderness: There is abdominal tenderness in the suprapubic area.  Skin:    General: Skin is warm and dry.  Neurological:     General: No focal deficit present.     Mental Status: She is alert and oriented to person, place, and time. Mental status is at baseline.     Gait: Gait normal.  Psychiatric:        Attention and Perception: Attention and perception normal.        Mood and Affect: Mood and affect normal.        Speech: Speech normal.        Behavior: Behavior normal. Behavior is cooperative.        Thought Content: Thought content normal.        Cognition and Memory: Cognition and memory normal.        Judgment: Judgment normal.     Assessment  Plan  Acute cystitis without hematuria - Plan: Urinalysis, Routine w reflex microscopic, Urine Culture, Urine  cytology ancillary only([place]), ciprofloxacin (CIPRO) 500 MG tablet bid prn   Yeast infection - Plan: fluconazole (DIFLUCAN) 150 MG tablet   Right ankle pain surg sch 05/25/20 with Dr. Al Corpus  [facility] sch fasting labs asap no labs since  06/07/19  flu shotutd Tdap 11/27/16 covid vx 2/2   Pt wants to get shingrix in our officedisc with pt disc with [facility] Dr. Archie Balboa to see if ok with being on cosenyntex  Mammogram10/10/2018 and breast examordered again DEXA 12/03/16 normal  Colonoscopy 03/04/13 2 hyperplastic polyps f/u in 10 years  Pap due last in 01/12/2015 negative negative HPV -prev used premarin vaginal atrophy -h/o abnormal pap remotely  -pap 09/02/2019 negative   Skin appt 8/20/20Dr. Sharlee Blew Smoker 6 months in college    Provider: Dr. French Ana McLean-Scocuzza-Internal Medicine

## 2020-05-09 NOTE — Telephone Encounter (Signed)
DOS 05/25/2020  REPAIR PERONEAL TENDON RT - 28086  [employer] EFFECTIVE DATE - 04/19/2020  PLAN DEDUCTIBLE - $2500.00 W/ $2500.00 REMAINING OUT OF POCKET - $5000.00 V/$8938.10 REMAINING COPAY $0.00 COINSURANCE - 30%  NO AUTH REQUIRED PER WEBSITE

## 2020-05-09 NOTE — Patient Instructions (Signed)
AZO with pyridium 2-3 days as needed    Urinary Tract Infection, Adult  A urinary tract infection (UTI) is an infection of any part of the urinary tract. The urinary tract includes the kidneys, ureters, bladder, and urethra. These organs make, store, and get rid of urine in the body. Your health care provider may use other names to describe the infection. An upper UTI affects the ureters and kidneys (pyelonephritis). A lower UTI affects the bladder (cystitis) and urethra (urethritis). What are the causes? Most urinary tract infections are caused by bacteria in your genital area, around the entrance to your urinary tract (urethra). These bacteria grow and cause inflammation of your urinary tract. What increases the risk? You are more likely to develop this condition if:  You have a urinary catheter that stays in place (indwelling).  You are not able to control when you urinate or have a bowel movement (you have incontinence).  You are female and you: ? Use a spermicide or diaphragm for birth control. ? Have low estrogen levels. ? Are pregnant.  You have certain genes that increase your risk (genetics).  You are sexually active.  You take antibiotic medicines.  You have a condition that causes your flow of urine to slow down, such as: ? An enlarged prostate, if you are female. ? Blockage in your urethra (stricture). ? A kidney stone. ? A nerve condition that affects your bladder control (neurogenic bladder). ? Not getting enough to drink, or not urinating often.  You have certain medical conditions, such as: ? Diabetes. ? A weak disease-fighting system (immunesystem). ? Sickle cell disease. ? Gout. ? Spinal cord injury. What are the signs or symptoms? Symptoms of this condition include:  Needing to urinate right away (urgently).  Frequent urination or passing small amounts of urine frequently.  Pain or burning with urination.  Blood in the urine.  Urine that smells bad  or unusual.  Trouble urinating.  Cloudy urine.  Vaginal discharge, if you are female.  Pain in the abdomen or the lower back. You may also have:  Vomiting or a decreased appetite.  Confusion.  Irritability or tiredness.  A fever.  Diarrhea. The first symptom in older adults may be confusion. In some cases, they may not have any symptoms until the infection has worsened. How is this diagnosed? This condition is diagnosed based on your medical history and a physical exam. You may also have other tests, including:  Urine tests.  Blood tests.  Tests for sexually transmitted infections (STIs). If you have had more than one UTI, a cystoscopy or imaging studies may be done to determine the cause of the infections. How is this treated? Treatment for this condition includes:  Antibiotic medicine.  Over-the-counter medicines to treat discomfort.  Drinking enough water to stay hydrated. If you have frequent infections or have other conditions such as a kidney stone, you may need to see a health care provider who specializes in the urinary tract (urologist). In rare cases, urinary tract infections can cause sepsis. Sepsis is a life-threatening condition that occurs when the body responds to an infection. Sepsis is treated in the hospital with IV antibiotics, fluids, and other medicines. Follow these instructions at home:  Medicines  Take over-the-counter and prescription medicines only as told by your health care provider.  If you were prescribed an antibiotic medicine, take it as told by your health care provider. Do not stop using the antibiotic even if you start to feel better. General instructions  Make sure you: ? Empty your bladder often and completely. Do not hold urine for long periods of time. ? Empty your bladder after sex. ? Wipe from front to back after a bowel movement if you are female. Use each tissue one time when you wipe.  Drink enough fluid to keep your  urine pale yellow.  Keep all follow-up visits as told by your health care provider. This is important. Contact a health care provider if:  Your symptoms do not get better after 1-2 days.  Your symptoms go away and then return. Get help right away if you have:  Severe pain in your back or your lower abdomen.  A fever.  Nausea or vomiting. Summary  A urinary tract infection (UTI) is an infection of any part of the urinary tract, which includes the kidneys, ureters, bladder, and urethra.  Most urinary tract infections are caused by bacteria in your genital area, around the entrance to your urinary tract (urethra).  Treatment for this condition often includes antibiotic medicines.  If you were prescribed an antibiotic medicine, take it as told by your health care provider. Do not stop using the antibiotic even if you start to feel better.  Keep all follow-up visits as told by your health care provider. This is important. This information is not intended to replace advice given to you by your health care provider. Make sure you discuss any questions you have with your health care provider. Document Revised: 09/23/2018 Document Reviewed: 04/15/2018 Elsevier Patient Education  2020 [employer].

## 2020-05-09 NOTE — Telephone Encounter (Signed)
Noted  

## 2020-05-10 LAB — URINE CYTOLOGY ANCILLARY ONLY
Bacterial Vaginitis-Urine: NEGATIVE
Candida Urine: NEGATIVE

## 2020-05-14 ENCOUNTER — Other Ambulatory Visit: Payer: Self-pay | Admitting: Internal Medicine

## 2020-05-14 DIAGNOSIS — N3000 Acute cystitis without hematuria: Secondary | ICD-10-CM

## 2020-05-14 NOTE — Progress Notes (Signed)
Sorry for the inconvenience. I called [facility] and they stated that they never received the sample in a culture tube. I'm unsure how that occurred but we will need to recollect.

## 2020-05-14 NOTE — Addendum Note (Signed)
Addended by: Warden Fillers on: 05/14/2020 04:36 PM   Modules accepted: Orders

## 2020-05-15 ENCOUNTER — Encounter: Payer: Self-pay | Admitting: Podiatry

## 2020-05-21 LAB — URINE CULTURE

## 2020-05-21 LAB — URINALYSIS, ROUTINE W REFLEX MICROSCOPIC
Bacteria, UA: NONE SEEN /HPF
Bilirubin Urine: NEGATIVE
Glucose, UA: NEGATIVE
Hyaline Cast: NONE SEEN /LPF
Ketones, ur: NEGATIVE
Nitrite: NEGATIVE
Protein, ur: NEGATIVE
RBC / HPF: NONE SEEN /HPF (ref 0–2)
Specific Gravity, Urine: 1.007 (ref 1.001–1.03)
pH: 6.5 (ref 5.0–8.0)

## 2020-05-23 ENCOUNTER — Other Ambulatory Visit: Payer: Self-pay | Admitting: Podiatry

## 2020-05-23 MED ORDER — OXYCODONE-ACETAMINOPHEN 10-325 MG PO TABS: 1.0000 | ORAL_TABLET | Freq: Three times a day (TID) | ORAL | 0 refills | Status: AC | PRN

## 2020-05-23 MED ORDER — ONDANSETRON HCL 4 MG PO TABS: 4.0000 mg | ORAL_TABLET | Freq: Three times a day (TID) | ORAL | 0 refills | Status: DC | PRN

## 2020-05-23 MED ORDER — CEPHALEXIN 500 MG PO CAPS: 500.0000 mg | ORAL_CAPSULE | Freq: Three times a day (TID) | ORAL | 0 refills | Status: DC

## 2020-05-24 ENCOUNTER — Telehealth: Payer: Self-pay

## 2020-05-24 ENCOUNTER — Other Ambulatory Visit: Payer: Self-pay

## 2020-05-24 DIAGNOSIS — Z87440 Personal history of urinary (tract) infections: Secondary | ICD-10-CM

## 2020-05-24 DIAGNOSIS — Z1152 Encounter for screening for COVID-19: Secondary | ICD-10-CM | POA: Diagnosis not present

## 2020-05-24 DIAGNOSIS — R05 Cough: Secondary | ICD-10-CM | POA: Diagnosis not present

## 2020-05-24 NOTE — Telephone Encounter (Signed)
Jennifer Mcintosh called and stated she wants to hold off on her surgery that is scheduled for 06/01/2020. She stated her foot has been feeling better since she has been wearing new shoes. She stated she will call and schedule a follow up visit with Dr. Al Corpus in the near future. I notified Aram Beecham at [facility] and Dr. Al Corpus.

## 2020-05-24 NOTE — Progress Notes (Signed)
Urine

## 2020-05-29 ENCOUNTER — Other Ambulatory Visit: Payer: BC Managed Care – PPO

## 2020-05-30 ENCOUNTER — Encounter: Payer: BC Managed Care – PPO | Admitting: Podiatry

## 2020-06-06 ENCOUNTER — Encounter: Payer: BC Managed Care – PPO | Admitting: Podiatry

## 2020-06-13 ENCOUNTER — Encounter: Payer: BC Managed Care – PPO | Admitting: Podiatry

## 2020-06-20 ENCOUNTER — Encounter: Payer: BC Managed Care – PPO | Admitting: Podiatry

## 2020-06-27 ENCOUNTER — Encounter: Payer: BC Managed Care – PPO | Admitting: Podiatry

## 2020-06-28 ENCOUNTER — Encounter: Admit: 2020-06-28 | Discharge: 2020-06-28 | Payer: PRIVATE HEALTH INSURANCE

## 2020-06-28 ENCOUNTER — Ambulatory Visit: Admit: 2020-06-28 | Discharge: 2020-06-28 | Payer: PRIVATE HEALTH INSURANCE

## 2020-06-28 DIAGNOSIS — L578 Other skin changes due to chronic exposure to nonionizing radiation: Principal | ICD-10-CM

## 2020-06-28 DIAGNOSIS — L821 Other seborrheic keratosis: Principal | ICD-10-CM

## 2020-06-28 DIAGNOSIS — L409 Psoriasis, unspecified: Principal | ICD-10-CM

## 2020-06-28 DIAGNOSIS — L570 Actinic keratosis: Principal | ICD-10-CM

## 2020-06-28 MED ORDER — SKYRIZI 150 MG/1.66 ML(75 MG/0.83 ML X 2) SUBCUTANEOUS SYRINGE KIT
SUBCUTANEOUS | 3 refills | 0.00000 days | Status: CP
Start: 2020-06-28 — End: ?
  Filled 2020-07-11: qty 1, 84d supply, fill #0

## 2020-06-29 DIAGNOSIS — L409 Psoriasis, unspecified: Principal | ICD-10-CM

## 2020-07-04 ENCOUNTER — Encounter: Payer: BC Managed Care – PPO | Admitting: Podiatry

## 2020-07-09 ENCOUNTER — Encounter: Payer: Self-pay | Admitting: Internal Medicine

## 2020-07-09 NOTE — Unmapped (Signed)
[facility] Specialty Medication Onboarding    Specialty Medication: Careers adviser  Prior Authorization: Approved   Financial Assistance: Yes - copay card approved as secondary   Final Copay/Day Supply: $5 / 84 days    Insurance Restrictions: None     Notes to Pharmacist:     The triage team has completed the benefits investigation and has determined that the patient is able to fill this medication at [facility]. Please contact the patient to complete the onboarding or follow up with the prescribing physician as needed.

## 2020-07-10 NOTE — Unmapped (Signed)
[facility]   Patient Onboarding/Medication Counseling    Ms.Salvador is a 59 y.o. female with plaque psoriasis who I am counseling today on continuation of therapy.  I am speaking to the patient.    Was a Nurse, learning disability used for this call? No    Verified patient's date of birth / HIPAA.    Specialty medication(s) to be sent: Inflammatory Disorders: Skyrizi      Non-specialty medications/supplies to be sent: n/a      Medications not needed at this time: n/a       The patient declined counseling on medication administration, missed dose instructions, goals of therapy, side effects and monitoring parameters, warnings and precautions, drug/food interactions and storage, handling precautions, and disposal because they have taken the medication previously. The information in the declined sections below are for informational purposes only and was not discussed with patient.     Skyrizi (risankizumab)    Medication & Administration     Dosage: Plaque psoriasis: Inject 150mg  under the skin at weeks 0 and 4, then every 12 weeks thereafter    Lab tests required prior to treatment initiation:  ??? Tuberculosis: Tuberculosis screening resulted in a non-reactive Quantiferon TB Gold assay.    Administration:     Prefilled syringe  1. Gather all supplies needed for injection on a clean, flat working surface: medication syringe(s) removed from packaging, alcohol swab, sharps container, etc.  2. Look at the medication label ??? look for correct medication, correct dose, and check the expiration date  3. Look at the medication ??? the liquid in the syringe should appear clear and colorless to slightly yellow, you may see tiny white or clear particles  4. Lay the syringe on a flat surface and allow it to warm up to room temperature for at least 30 minutes  5. Select injection site ??? you can use the front of your thigh or your belly (but not the area 2 inches around your belly button); if someone else is giving you the injection you can also use your upper arm in the skin covering your triceps muscle  6. Prepare injection site ??? wash your hands and clean the skin at the injection site with an alcohol swab and let it air dry, do not touch the injection site again before the injection  7. Pull off the needle safety cap, do not remove until immediately prior to injection; turn the syringe so the needle is facing up and hold the syringe at eye level with one hand so you can see the air in the syringe; using your other hand, slowly push the plunger in to push the air out through the needle  8. Pinch the skin ??? with your hand not holding the syringe pinch up a fold of skin at the injection site using your forefinger and thumb  9. Insert the needle into the fold of skin at about a 45 degree angle ??? it's best to use a quick dart-like motion  10. Push the plunger down slowly as far as it will go until the syringe is empty, if the plunger is not fully depressed the needle shield will not extend to cover the needle when it is removed, hold the syringe in place for a full 5 seconds  11. Check that the syringe is empty and keep pressing down on the plunger while you pull the needle out at the same angle as inserted; after the needle is removed completely from the skin, release the  plunger allowing the needle shield to activate and cover the used needle  12. Dispose of the used syringe immediately in your sharps disposal container, do not attempt to recap the needle prior to disposing  13. If you see any blood at the injection site, press a cotton ball or gauze on the site and maintain pressure until the bleeding stops, do not rub the injection site    Adherence/Missed dose instructions:  If your injection is given more than 7 days after your scheduled injection date ??? consult your pharmacist for additional instructions on how to adjust your dosing schedule.    Goals of Therapy     ??? Minimize areas of skin involvement (% BSA)  ??? Avoidance of long term glucocorticoid use  ??? Maintenance of effective psychosocial functioning      Side Effects & Monitoring Parameters     ??? Injection site reaction (redness, irritation, inflammation localized to the site of administration)  ??? Signs of a common cold ??? minor sore throat, runny or stuffy nose, etc.  ??? Felling tired/weak  ??? Headache    The following side effects should be reported to the provider:  ??? Signs of a hypersensitivity reaction ??? rash; hives; itching; red, swollen, blistered, or peeling skin; wheezing; tightness in the chest or throat; difficulty breathing, swallowing, or talking; swelling of the mouth, face, lips, tongue, or throat; etc.  ??? Reduced immune function ??? report signs of infection such as fever; chills; body aches; very bad sore throat; ear or sinus pain; cough; more sputum or change in color of sputum; pain with passing urine; wound that will not heal, etc.  Also at a slightly higher risk of some malignancies (mainly skin and blood cancers) due to this reduced immune function.  o In the case of signs of infection ??? the patient should hold the next dose of Skyrizi?? and call your primary care provider to ensure adequate medical care.  Treatment may be resumed when infection is treated and patient is asymptomatic.  ??? Flu-like symptoms  ??? Warm, red, or painful skin or sores on the body      Contraindications, Warnings, & Precautions     ??? Have your bloodwork checked as you have been told by your prescriber  ??? Talk with your doctor if you are pregnant, planning to become pregnant, or breastfeeding  ??? Discuss the possible need for holding your dose(s) of Skyrizi?? when a planned procedure is scheduled with the prescriber as it may delay healing/recovery timeline       Drug/Food Interactions     ??? Medication list reviewed in Epic. The patient was instructed to inform the care team before taking any new medications or supplements. No drug interactions identified.   ??? Talk with you prescriber or pharmacist before receiving any live vaccinations while taking this medication and after you stop taking it    Storage, Handling Precautions, & Disposal     ??? Store this medication in the refrigerator.  Do not freeze  ??? If needed, you may store at room temperature for up to 24 hours  ??? Store in original packaging, protected from light  ??? Do not shake  ??? Dispose of used syringes/pens in a sharps disposal container            Current Medications (including OTC/herbals), Comorbidities and Allergies     Current Outpatient Medications   Medication Sig Dispense Refill   ??? ALPRAZolam (XANAX) 0.25 MG tablet take 1 tablet by mouth once  daily if needed for anxiety  0   ??? clobetasol-emollient 0.05 % topical foam Apply to scalp for psoriasis once to twice daily as needed 100 g 5   ??? risankizumab-rzaa (SKYRIZI) 150mg /1.44mL(75 mg/0.83 mL x2) SyKt Inject the contents of 2 syringes (150mg  total) under the skin at weeks 0 and 4, then once every 12 weeks thereafter. 4 each 1   ??? risankizumab-rzaa (SKYRIZI) 150 mg/mL Syrg Inject the contents of 1 syringe (150 mg) under the skin every 12 weeks 1 mL 3   ??? triamcinolone (KENALOG) 0.1 % ointment Apply twice a day to affected areas 454 g 3     No current facility-administered medications for this visit.       No Known Allergies    Patient Active Problem List   Diagnosis   ??? Acute frontal sinusitis   ??? Psoriasis   ??? Vaginal dryness       Reviewed and up to date in Epic.    Appropriateness of Therapy     Is medication and dose appropriate based on diagnosis? Yes    Prescription has been clinically reviewed: Yes    Baseline Quality of Life Assessment      How many days over the past month did your psoriasis  keep you from your normal activities? For example, brushing your teeth or getting up in the morning. 0    Financial Information     Medication Assistance provided: Prior Authorization and Copay Assistance    Anticipated copay of $5 / 84days reviewed with patient. Verified delivery address.    Delivery Information     Scheduled delivery date: 07/11/20    Expected start date: 07/11/20    Medication will be delivered via Same Day Courier to the prescription address in [facility].  This shipment will not require a signature.      Explained the services we provide at [facility] and that each month we would call to set up refills.  Stressed importance of returning phone calls so that we could ensure they receive their medications in time each month.  Informed patient that we should be setting up refills 7-10 days prior to when they will run out of medication.  A pharmacist will reach out to perform a clinical assessment periodically.  Informed patient that a welcome packet and a drug information handout will be sent.      Patient verbalized understanding of the above information as well as how to contact the pharmacy at [phone] option 4 with any questions/concerns.  The pharmacy is open Monday through Friday 8:30am-4:30pm.  A pharmacist is available 24/7 via pager to answer any clinical questions they may have.    Patient Specific Needs     - Does the patient have any physical, cognitive, or cultural barriers? No    - Patient prefers to have medications discussed with  Patient     - Is the patient or caregiver able to read and understand education materials at a high school level or above? Yes    - Patient's primary language is  English     - Is the patient high risk? No    - Does the patient require a Care Management Plan? No     - Does the patient require physician intervention or other additional services (i.e. nutrition, smoking cessation, social work)? No      Dynisha Due Vangie Bicker  [facility] Specialty Pharmacist

## 2020-07-11 ENCOUNTER — Encounter: Payer: BC Managed Care – PPO | Admitting: Podiatry

## 2020-07-11 MED FILL — SKYRIZI 150 MG/ML SUBCUTANEOUS SYRINGE: 84 days supply | Qty: 1 | Fill #0 | Status: AC

## 2020-07-25 ENCOUNTER — Encounter: Payer: BC Managed Care – PPO | Admitting: Podiatry

## 2020-08-01 ENCOUNTER — Encounter: Payer: BC Managed Care – PPO | Admitting: Podiatry

## 2020-08-15 ENCOUNTER — Encounter: Payer: BC Managed Care – PPO | Admitting: Podiatry

## 2020-08-29 ENCOUNTER — Encounter: Payer: BC Managed Care – PPO | Admitting: Podiatry

## 2020-09-21 ENCOUNTER — Ambulatory Visit (INDEPENDENT_AMBULATORY_CARE_PROVIDER_SITE_OTHER): Payer: BC Managed Care – PPO

## 2020-09-21 ENCOUNTER — Encounter: Payer: Self-pay | Admitting: Internal Medicine

## 2020-09-21 ENCOUNTER — Other Ambulatory Visit: Payer: Self-pay

## 2020-09-21 ENCOUNTER — Ambulatory Visit (INDEPENDENT_AMBULATORY_CARE_PROVIDER_SITE_OTHER): Payer: BC Managed Care – PPO | Admitting: Internal Medicine

## 2020-09-21 VITALS — BP 116/80 | HR 81 | Temp 99.1°F | Ht 68.5 in | Wt 158.2 lb

## 2020-09-21 DIAGNOSIS — F32A Depression, unspecified: Secondary | ICD-10-CM

## 2020-09-21 DIAGNOSIS — M542 Cervicalgia: Secondary | ICD-10-CM | POA: Diagnosis not present

## 2020-09-21 DIAGNOSIS — M19049 Primary osteoarthritis, unspecified hand: Secondary | ICD-10-CM

## 2020-09-21 DIAGNOSIS — Z13220 Encounter for screening for lipoid disorders: Secondary | ICD-10-CM | POA: Diagnosis not present

## 2020-09-21 DIAGNOSIS — Z0000 Encounter for general adult medical examination without abnormal findings: Secondary | ICD-10-CM | POA: Diagnosis not present

## 2020-09-21 DIAGNOSIS — F419 Anxiety disorder, unspecified: Secondary | ICD-10-CM

## 2020-09-21 DIAGNOSIS — Z1329 Encounter for screening for other suspected endocrine disorder: Secondary | ICD-10-CM | POA: Diagnosis not present

## 2020-09-21 LAB — CBC WITH DIFFERENTIAL/PLATELET
Basophils Absolute: 0.1 10*3/uL (ref 0.0–0.1)
Basophils Relative: 1.2 % (ref 0.0–3.0)
Eosinophils Absolute: 0.1 10*3/uL (ref 0.0–0.7)
Eosinophils Relative: 1.6 % (ref 0.0–5.0)
HCT: 42.4 % (ref 36.0–46.0)
Hemoglobin: 14.2 g/dL (ref 12.0–15.0)
Lymphocytes Relative: 15.1 % (ref 12.0–46.0)
Lymphs Abs: 0.9 10*3/uL (ref 0.7–4.0)
MCHC: 33.5 g/dL (ref 30.0–36.0)
MCV: 94.5 fl (ref 78.0–100.0)
Monocytes Absolute: 0.5 10*3/uL (ref 0.1–1.0)
Monocytes Relative: 7.9 % (ref 3.0–12.0)
Neutro Abs: 4.3 10*3/uL (ref 1.4–7.7)
Neutrophils Relative %: 74.2 % (ref 43.0–77.0)
Platelets: 245 10*3/uL (ref 150.0–400.0)
RBC: 4.49 Mil/uL (ref 3.87–5.11)
RDW: 12.9 % (ref 11.5–15.5)
WBC: 5.8 10*3/uL (ref 4.0–10.5)

## 2020-09-21 LAB — COMPREHENSIVE METABOLIC PANEL
ALT: 12 U/L (ref 0–35)
AST: 17 U/L (ref 0–37)
Albumin: 4.5 g/dL (ref 3.5–5.2)
Alkaline Phosphatase: 56 U/L (ref 39–117)
BUN: 14 mg/dL (ref 6–23)
CO2: 31 mEq/L (ref 19–32)
Calcium: 9.6 mg/dL (ref 8.4–10.5)
Chloride: 102 mEq/L (ref 96–112)
Creatinine, Ser: 0.89 mg/dL (ref 0.40–1.20)
GFR: 70.6 mL/min (ref >60.00)
Glucose, Bld: 89 mg/dL (ref 70–99)
Potassium: 4.2 mEq/L (ref 3.5–5.1)
Sodium: 140 mEq/L (ref 135–145)
Total Bilirubin: 0.8 mg/dL (ref 0.2–1.2)
Total Protein: 7 g/dL (ref 6.0–8.3)

## 2020-09-21 LAB — TSH: TSH: 1.49 u[IU]/mL (ref 0.35–4.50)

## 2020-09-21 LAB — LIPID PANEL
Cholesterol: 189 mg/dL (ref 0–200)
HDL: 83.3 mg/dL (ref >39.00)
LDL Cholesterol: 92 mg/dL (ref 0–99)
NonHDL: 105.21
Total CHOL/HDL Ratio: 2
Triglycerides: 64 mg/dL (ref 0.0–149.0)
VLDL: 12.8 mg/dL (ref 0.0–40.0)

## 2020-09-21 MED ORDER — CITALOPRAM HYDROBROMIDE 10 MG PO TABS: 10.0000 mg | ORAL_TABLET | Freq: Every day | ORAL | 3 refills | Status: DC

## 2020-09-21 NOTE — Patient Instructions (Addendum)
Tumeric/ginger/Curcumin  Collagen vital protein Glucosamine chrondroitin   Voltaren gel 4x per day  Lidocaine/salonpas pain patch    Cervical Strain and Sprain Rehab Ask your health care provider which exercises are safe for you. Do exercises exactly as told by your health care provider and adjust them as directed. It is normal to feel mild stretching, pulling, tightness, or discomfort as you do these exercises. Stop right away if you feel sudden pain or your pain gets worse. Do not begin these exercises until told by your health care provider. Stretching and range-of-motion exercises Cervical side bending  1. Using good posture, sit on a stable chair or stand up. 2. Without moving your shoulders, slowly tilt your left / right ear to your shoulder until you feel a stretch in the opposite side neck muscles. You should be looking straight ahead. 3. Hold for __________ seconds. 4. Repeat with the other side of your neck. Repeat __________ times. Complete this exercise __________ times a day. Cervical rotation  1. Using good posture, sit on a stable chair or stand up. 2. Slowly turn your head to the side as if you are looking over your left / right shoulder. ? Keep your eyes level with the ground. ? Stop when you feel a stretch along the side and the back of your neck. 3. Hold for __________ seconds. 4. Repeat this by turning to your other side. Repeat __________ times. Complete this exercise __________ times a day. Thoracic extension and pectoral stretch 1. Roll a towel or a small blanket so it is about 4 inches (10 cm) in diameter. 2. Lie down on your back on a firm surface. 3. Put the towel lengthwise, under your spine in the middle of your back. It should not be under your shoulder blades. The towel should line up with your spine from your middle back to your lower back. 4. Put your hands behind your head and let your elbows fall out to your sides. 5. Hold for __________  seconds. Repeat __________ times. Complete this exercise __________ times a day. Strengthening exercises Isometric upper cervical flexion 1. Lie on your back with a thin pillow behind your head and a small rolled-up towel under your neck. 2. Gently tuck your chin toward your chest and nod your head down to look toward your feet. Do not lift your head off the pillow. 3. Hold for __________ seconds. 4. Release the tension slowly. Relax your neck muscles completely before you repeat this exercise. Repeat __________ times. Complete this exercise __________ times a day. Isometric cervical extension  1. Stand about 6 inches (15 cm) away from a wall, with your back facing the wall. 2. Place a soft object, about 6-8 inches (15-20 cm) in diameter, between the back of your head and the wall. A soft object could be a small pillow, a ball, or a folded towel. 3. Gently tilt your head back and press into the soft object. Keep your jaw and forehead relaxed. 4. Hold for __________ seconds. 5. Release the tension slowly. Relax your neck muscles completely before you repeat this exercise. Repeat __________ times. Complete this exercise __________ times a day. Posture and body mechanics Body mechanics refers to the movements and positions of your body while you do your daily activities. Posture is part of body mechanics. Good posture and healthy body mechanics can help to relieve stress in your body's tissues and joints. Good posture means that your spine is in its natural S-curve position (your spine is neutral),  your shoulders are pulled back slightly, and your head is not tipped forward. The following are general guidelines for applying improved posture and body mechanics to your everyday activities. Sitting  1. When sitting, keep your spine neutral and keep your feet flat on the floor. Use a footrest, if necessary, and keep your thighs parallel to the floor. Avoid rounding your shoulders, and avoid tilting  your head forward. 2. When working at a desk or a computer, keep your desk at a height where your hands are slightly lower than your elbows. Slide your chair under your desk so you are close enough to maintain good posture. 3. When working at a computer, place your monitor at a height where you are looking straight ahead and you do not have to tilt your head forward or downward to look at the screen. Standing   When standing, keep your spine neutral and keep your feet about hip-width apart. Keep a slight bend in your knees. Your ears, shoulders, and hips should line up.  When you do a task in which you stand in one place for a long time, place one foot up on a stable object that is 2-4 inches (5-10 cm) high, such as a footstool. This helps keep your spine neutral. Resting When lying down and resting, avoid positions that are most painful for you. Try to support your neck in a neutral position. You can use a contour pillow or a small rolled-up towel. Your pillow should support your neck but not push on it. This information is not intended to replace advice given to you by your health care provider. Make sure you discuss any questions you have with your health care provider. Document Revised: 01/26/2019 Document Reviewed: 07/07/2018 Elsevier Patient Education  2020 [employer]  Arthritis Arthritis is a term that is commonly used to refer to joint pain or joint disease. There are more than 100 types of arthritis. What are the causes? The most common cause of this condition is wear and tear of a joint. Other causes include:  Gout.  Inflammation of a joint.  An infection of a joint.  Sprains and other injuries near the joint.  A reaction to medicines or drugs, or an allergic reaction. In some cases, the cause may not be known. What are the signs or symptoms? The main symptom of this condition is pain in the joint during movement. Other symptoms include:  Redness, swelling, or stiffness  at a joint.  Warmth coming from the joint.  Fever.  Overall feeling of illness. How is this diagnosed? This condition may be diagnosed with a physical exam and tests, including:  Blood tests.  Urine tests.  Imaging tests, such as X-rays, an MRI, or a CT scan. Sometimes, fluid is removed from a joint for testing. How is this treated? This condition may be treated with:  Treatment of the cause, if it is known.  Rest.  Raising (elevating) the joint.  Applying cold or hot packs to the joint.  Medicines to improve symptoms and reduce inflammation.  Injections of a steroid such as cortisone into the joint to help reduce pain and inflammation. Depending on the cause of your arthritis, you may need to make lifestyle changes to reduce stress on your joint. Changes may include:  Exercising more.  Losing weight. Follow these instructions at home: Medicines  Take over-the-counter and prescription medicines only as told by your health care provider.  Do not take aspirin to relieve pain if your health care  provider thinks that gout may be causing your pain. Activity  Rest your joint if told by your health care provider. Rest is important when your disease is active and your joint feels painful, swollen, or stiff.  Avoid activities that make the pain worse. It is important to balance activity with rest.  Exercise your joint regularly with range-of-motion exercises as told by your health care provider. Try doing low-impact exercise, such as: ? Swimming. ? Water aerobics. ? Biking. ? Walking. Managing pain, stiffness, and swelling      If directed, put ice on the joint. ? Put ice in a plastic bag. ? Place a towel between your skin and the bag. ? Leave the ice on for 20 minutes, 2-3 times per day.  If your joint is swollen, raise (elevate) it above the level of your heart if directed by your health care provider.  If your joint feels stiff in the morning, try taking a  warm shower.  If directed, apply heat to the affected area as often as told by your health care provider. Use the heat source that your health care provider recommends, such as a moist heat pack or a heating pad. If you have diabetes, do not apply heat without permission from your health care provider. To apply heat: ? Place a towel between your skin and the heat source. ? Leave the heat on for 20-30 minutes. ? Remove the heat if your skin turns bright red. This is especially important if you are unable to feel pain, heat, or cold. You may have a greater risk of getting burned. General instructions  Do not use any products that contain nicotine or tobacco, such as cigarettes, e-cigarettes, and chewing tobacco. If you need help quitting, ask your health care provider.  Keep all follow-up visits as told by your health care provider. This is important. Contact a health care provider if:  The pain gets worse.  You have a fever. Get help right away if:  You develop severe joint pain, swelling, or redness.  Many joints become painful and swollen.  You develop severe back pain.  You develop severe weakness in your leg.  You cannot control your bladder or bowels. Summary  Arthritis is a term that is commonly used to refer to joint pain or joint disease. There are more than 100 types of arthritis.  The most common cause of this condition is wear and tear of a joint. Other causes include gout, inflammation or infection of the joint, sprains, or allergies.  Symptoms of this condition include redness, swelling, or stiffness of the joint. Other symptoms include warmth, fever, or feeling ill.  This condition is treated with rest, elevation, medicines, and applying cold or hot packs.  Follow your health care provider's instructions about medicines, activity, exercises, and other home care treatments. This information is not intended to replace advice given to you by your health care provider.  Make sure you discuss any questions you have with your health care provider. Document Revised: 09/13/2018 Document Reviewed: 09/13/2018 Elsevier Patient Education  2020 [employer]  Citalopram tablets What is this medicine? CITALOPRAM (sye TAL oh pram) is a medicine for depression. This medicine may be used for other purposes; ask your health care provider or pharmacist if you have questions. COMMON BRAND NAME(S): Celexa What should I tell my health care provider before I take this medicine? They need to know if you have any of these conditions:  bleeding disorders  bipolar disorder or a family  history of bipolar disorder  glaucoma  heart disease  history of irregular heartbeat  kidney disease  liver disease  low levels of magnesium or potassium in the blood  receiving electroconvulsive therapy  seizures  suicidal thoughts, plans, or attempt; a previous suicide attempt by you or a family member  take medicines that treat or prevent blood clots  thyroid disease  an unusual or allergic reaction to citalopram, escitalopram, other medicines, foods, dyes, or preservatives  pregnant or trying to become pregnant  breast-feeding How should I use this medicine? Take this medicine by mouth with a glass of water. Follow the directions on the prescription label. You can take it with or without food. Take your medicine at regular intervals. Do not take your medicine more often than directed. Do not stop taking this medicine suddenly except upon the advice of your doctor. Stopping this medicine too quickly may cause serious side effects or your condition may worsen. A special MedGuide will be given to you by the pharmacist with each prescription and refill. Be sure to read this information carefully each time. Talk to your pediatrician regarding the use of this medicine in children. Special care may be needed. Patients over 27 years old may have a stronger reaction and need a  smaller dose. Overdosage: If you think you have taken too much of this medicine contact a poison control center or emergency room at once. NOTE: This medicine is only for you. Do not share this medicine with others. What if I miss a dose? If you miss a dose, take it as soon as you can. If it is almost time for your next dose, take only that dose. Do not take double or extra doses. What may interact with this medicine? Do not take this medicine with any of the following medications:  certain medicines for fungal infections like fluconazole, itraconazole, ketoconazole, posaconazole, voriconazole  cisapride  dronedarone  escitalopram  linezolid  MAOIs like Carbex, Eldepryl, Marplan, Nardil, and Parnate  methylene blue (injected into a vein)  pimozide  thioridazine This medicine may also interact with the following medications:  alcohol  amphetamines  aspirin and aspirin-like medicines  carbamazepine  certain medicines for depression, anxiety, or psychotic disturbances  certain medicines for infections like chloroquine, clarithromycin, erythromycin, furazolidone, isoniazid, pentamidine  certain medicines for migraine headaches like almotriptan, eletriptan, frovatriptan, naratriptan, rizatriptan, sumatriptan, zolmitriptan  certain medicines for sleep  certain medicines that treat or prevent blood clots like dalteparin, enoxaparin, warfarin  cimetidine  diuretics  dofetilide  fentanyl  lithium  methadone  metoprolol  NSAIDs, medicines for pain and inflammation, like ibuprofen or naproxen  omeprazole  other medicines that prolong the QT interval (cause an abnormal heart rhythm)  procarbazine  rasagiline  supplements like St. John's wort, kava kava, valerian  tramadol  tryptophan  ziprasidone This list may not describe all possible interactions. Give your health care provider a list of all the medicines, herbs, non-prescription drugs, or dietary  supplements you use. Also tell them if you smoke, drink alcohol, or use illegal drugs. Some items may interact with your medicine. What should I watch for while using this medicine? Tell your doctor if your symptoms do not get better or if they get worse. Visit your doctor or health care professional for regular checks on your progress. Because it may take several weeks to see the full effects of this medicine, it is important to continue your treatment as prescribed by your doctor. Patients and their families should watch  out for new or worsening thoughts of suicide or depression. Also watch out for sudden changes in feelings such as feeling anxious, agitated, panicky, irritable, hostile, aggressive, impulsive, severely restless, overly excited and hyperactive, or not being able to sleep. If this happens, especially at the beginning of treatment or after a change in dose, call your health care professional. Bonita Quin may get drowsy or dizzy. Do not drive, use machinery, or do anything that needs mental alertness until you know how this medicine affects you. Do not stand or sit up quickly, especially if you are an older patient. This reduces the risk of dizzy or fainting spells. Alcohol may interfere with the effect of this medicine. Avoid alcoholic drinks. Your mouth may get dry. Chewing sugarless gum or sucking hard candy, and drinking plenty of water will help. Contact your doctor if the problem does not go away or is severe. What side effects may I notice from receiving this medicine? Side effects that you should report to your doctor or health care professional as soon as possible:  allergic reactions like skin rash, itching or hives, swelling of the face, lips, or tongue  anxious  black, tarry stools  breathing problems  changes in vision  chest pain  confusion  elevated mood, decreased need for sleep, racing thoughts, impulsive behavior  eye pain  fast, irregular heartbeat  feeling  faint or lightheaded, falls  feeling agitated, angry, or irritable  hallucination, loss of contact with reality  loss of balance or coordination  loss of memory  painful or prolonged erections  restlessness, pacing, inability to keep still  seizures  stiff muscles  suicidal thoughts or other mood changes  trouble sleeping  unusual bleeding or bruising  unusually weak or tired  vomiting Side effects that usually do not require medical attention (report to your doctor or health care professional if they continue or are bothersome):  change in appetite or weight  change in sex drive or performance  dizziness  headache  increased sweating  indigestion, nausea  tremors This list may not describe all possible side effects. Call your doctor for medical advice about side effects. You may report side effects to FDA at 1-800-FDA-1088. Where should I keep my medicine? Keep out of reach of children. Store at room temperature between 15 and 30 degrees C (59 and 86 degrees F). Throw away any unused medicine after the expiration date. NOTE: This sheet is a summary. It may not cover all possible information. If you have questions about this medicine, talk to your doctor, pharmacist, or health care provider.  2020 Elsevier/Gold Standard (2018-09-27 09:05:36)  Actinic Keratosis An actinic keratosis is a precancerous growth on the skin. If there is more than one growth, the condition is called actinic keratoses. Actinic keratoses appear most often on areas of skin that get a lot of sun exposure, including the scalp, face, ears, lips, upper back, forearms, and the backs of the hands. If left untreated, these growths may develop into a skin cancer called squamous cell carcinoma. It is important to have all these growths checked by a health care provider to determine the best treatment approach. What are the causes? Actinic keratoses are caused by getting too much ultraviolet (UV)  radiation from the sun or other UV light sources. What increases the risk? You are more likely to develop this condition if you:  Have light-colored skin and blue eyes.  Have blond or red hair.  Spend a lot of time in the sun.  Do not protect your skin from the sun when outdoors.  Are an older person. The risk of developing an actinic keratosis increases with age. What are the signs or symptoms? Actinic keratoses feel like scaly, rough spots of skin. Symptoms of this condition include growths that may:  Be as small as a pinhead or as big as a quarter.  Itch, hurt, or feel sensitive.  Be skin-colored, light tan, dark tan, pink, or a combination of any of these colors. In most cases, the growths become red.  Have a small piece of pink or gray skin (skin tag) growing from them. It may be easier to notice actinic keratoses by feeling them, rather than seeing them. Sometimes, actinic keratoses disappear, but many reappear a few days to a few weeks later. How is this diagnosed? This condition is usually diagnosed with a physical exam.  A tissue sample may be removed from the actinic keratosis and examined under a microscope (biopsy). How is this treated? If needed, this condition may be treated by:  Scraping off the actinic keratosis (curettage).  Freezing the actinic keratosis with liquid nitrogen (cryosurgery). This causes the growth to eventually fall off the skin.  Applying medicated creams or gels to destroy the cells in the growth.  Applying chemicals to the actinic keratosis to make the outer layers of skin peel off (chemical peel).  Using photodynamic therapy. In this procedure, medicated cream is applied to the actinic keratosis. This cream increases your skin's sensitivity to light. Then, a strong light is aimed at the actinic keratosis to destroy cells in the growth. Follow these instructions at home: Skin care  Apply cool, wet cloths (cool compresses) to the affected  areas.  Do not scratch your skin.  Check your skin regularly for any growths, especially growths that: ? Start to itch or bleed. ? Change in size, shape, or color. Caring for the treated area  Keep the treated area clean and dry as told by your health care provider.  Do not apply any medicine, cream, or lotion to the treated area unless your health care provider tells you to do that.  Do not pick at blisters or try to break them open. This can cause infection and scarring.  If you have red or irritated skin after treatment, follow instructions from your health care provider about how to take care of the treated area. Make sure you: ? Wash your hands with soap and water before you change your bandage (dressing). If soap and water are not available, use hand sanitizer. ? Change your dressing as told by your health care provider.  If you have red or irritated skin after treatment, check your treated area every day for signs of infection. Check for: ? Redness, swelling, or pain. ? Fluid or blood. ? Warmth. ? Pus or a bad smell. General instructions  Take or apply over-the-counter and prescription medicines only as told by your health care provider.  Return to your normal activities as told by your health care provider. Ask your health care provider what activities are safe for you.  Have a skin exam done every year by a health care provider who is a skin specialist (dermatologist).  Keep all follow-up visits as told by your health care provider. This is important. Lifestyle  Do not use any products that contain nicotine or tobacco, such as cigarettes and e-cigarettes. If you need help quitting, ask your health care provider.  Take steps to protect your skin from the sun. ?  Try to avoid the sun between 10:00 a.m. and 4:00 p.m. This is when the UV light is the strongest. ? Use a sunscreen or sunblock with SPF 30 (sun protection factor 30) or greater. ? Apply sunscreen before you are  exposed to sunlight and reapply as often as directed by the instructions on the sunscreen container. ? Always wear sunglasses that have UV protection, and always wear a hat and clothing to protect your skin from sunlight. ? When possible, avoid medicines that increase your sensitivity to sunlight. ? Do not use tanning beds or other indoor tanning devices. Contact a health care provider if:  You notice any changes or new growths on your skin.  You have swelling, pain, or more redness around your treated area.  You have fluid or blood coming from your treated area.  Your treated area feels warm to the touch.  You have pus or a bad smell coming from your treated area.  You have a fever.  You have a blister that becomes large and painful. Summary  An actinic keratosis is a precancerous growth on the skin. If there is more than one growth, the condition is called actinic keratoses. In some cases, if left untreated, these growths can develop into skin cancer.  Check your skin regularly for any growths, especially growths that start to itch or bleed, or change in size, shape, or color.  Take steps to protect your skin from the sun.  Contact a health care provider if you notice any changes or new growths on your skin.  Keep all follow-up visits as told by your health care provider. This is important. This information is not intended to replace advice given to you by your health care provider. Make sure you discuss any questions you have with your health care provider. Document Revised: 02/16/2018 Document Reviewed: 02/16/2018 Elsevier Patient Education  2020 [employer]   Neck Exercises Ask your health care provider which exercises are safe for you. Do exercises exactly as told by your health care provider and adjust them as directed. It is normal to feel mild stretching, pulling, tightness, or discomfort as you do these exercises. Stop right away if you feel sudden pain or your pain  gets worse. Do not begin these exercises until told by your health care provider. Neck exercises can be important for many reasons. They can improve strength and maintain flexibility in your neck, which will help your upper back and prevent neck pain. Stretching exercises Rotation neck stretching  1. Sit in a chair or stand up. 2. Place your feet flat on the floor, shoulder width apart. 3. Slowly turn your head (rotate) to the right until a slight stretch is felt. Turn it all the way to the right so you can look over your right shoulder. Do not tilt or tip your head. 4. Hold this position for 10-30 seconds. 5. Slowly turn your head (rotate) to the left until a slight stretch is felt. Turn it all the way to the left so you can look over your left shoulder. Do not tilt or tip your head. 6. Hold this position for 10-30 seconds. Repeat __________ times. Complete this exercise __________ times a day. Neck retraction 1. Sit in a sturdy chair or stand up. 2. Look straight ahead. Do not bend your neck. 3. Use your fingers to push your chin backward (retraction). Do not bend your neck for this movement. Continue to face straight ahead. If you are doing the exercise properly, you will  feel a slight sensation in your throat and a stretch at the back of your neck. 4. Hold the stretch for 1-2 seconds. Repeat __________ times. Complete this exercise __________ times a day. Strengthening exercises Neck press 1. Lie on your back on a firm bed or on the floor with a pillow under your head. 2. Use your neck muscles to push your head down on the pillow and straighten your spine. 3. Hold the position as well as you can. Keep your head facing up (in a neutral position) and your chin tucked. 4. Slowly count to 5 while holding this position. Repeat __________ times. Complete this exercise __________ times a day. Isometrics These are exercises in which you strengthen the muscles in your neck while keeping your  neck still (isometrics). 1. Sit in a supportive chair and place your hand on your forehead. 2. Keep your head and face facing straight ahead. Do not flex or extend your neck while doing isometrics. 3. Push forward with your head and neck while pushing back with your hand. Hold for 10 seconds. 4. Do the sequence again, this time putting your hand against the back of your head. Use your head and neck to push backward against the hand pressure. 5. Finally, do the same exercise on either side of your head, pushing sideways against the pressure of your hand. Repeat __________ times. Complete this exercise __________ times a day. Prone head lifts 1. Lie face-down (prone position), resting on your elbows so that your chest and upper back are raised. 2. Start with your head facing downward, near your chest. Position your chin either on or near your chest. 3. Slowly lift your head upward. Lift until you are looking straight ahead. Then continue lifting your head as far back as you can comfortably stretch. 4. Hold your head up for 5 seconds. Then slowly lower it to your starting position. Repeat __________ times. Complete this exercise __________ times a day. Supine head lifts 1. Lie on your back (supine position), bending your knees to point to the ceiling and keeping your feet flat on the floor. 2. Lift your head slowly off the floor, raising your chin toward your chest. 3. Hold for 5 seconds. Repeat __________ times. Complete this exercise __________ times a day. Scapular retraction 1. Stand with your arms at your sides. Look straight ahead. 2. Slowly pull both shoulders (scapulae) backward and downward (retraction) until you feel a stretch between your shoulder blades in your upper back. 3. Hold for 10-30 seconds. 4. Relax and repeat. Repeat __________ times. Complete this exercise __________ times a day. Contact a health care provider if:  Your neck pain or discomfort gets much worse when you do  an exercise.  Your neck pain or discomfort does not improve within 2 hours after you exercise. If you have any of these problems, stop exercising right away. Do not do the exercises again unless your health care provider says that you can. Get help right away if:  You develop sudden, severe neck pain. If this happens, stop exercising right away. Do not do the exercises again unless your health care provider says that you can. This information is not intended to replace advice given to you by your health care provider. Make sure you discuss any questions you have with your health care provider. Document Revised: 08/04/2018 Document Reviewed: 08/04/2018

## 2020-09-21 NOTE — Progress Notes (Signed)
Chief Complaint  Patient presents with  . Annual Exam   Annual 1. Chronic neck pain with ROm 1/10 and hand pain b/l. Pt did not do ankle surgery due to sick with covid 05/2020 got from her son will resch ankle surgery. Pt c/o neck pain also since 08/2019  Tried otc advil 2 in the am with relief at times She does have psoriasis and on skyrizi Q12 weeks per [facility] derm Dr. Archie Balboa   2. Anxiety and depression and insomnia son 80 y.o is depressed she will set up therapy in [place] online or [facility] [place] She wants to try medication for mood as husband is worried about her. She has prn xanax but has not taking it   3. UTI sx's resolved   Review of Systems  Constitutional: Negative for weight loss.  HENT: Negative for hearing loss.   Eyes: Negative for blurred vision.  Respiratory: Negative for shortness of breath.   Cardiovascular: Negative for chest pain.  Gastrointestinal: Negative for abdominal pain.  Genitourinary: Negative for dysuria.  Musculoskeletal: Positive for joint pain.  Skin: Negative for rash.  Neurological: Negative for headaches.  Psychiatric/Behavioral: Positive for depression. The patient is nervous/anxious and has insomnia.    Past Medical History:  Diagnosis Date  . Allergy    avoids antihistamines 2/2 intolerance   . Anxiety   . Breast cyst    drained by Dr. Micah Noel in the past   . Depression    took meds in 1998 x few months per Dr. Thedore Mins notes  . Psoriasis   . UTI (urinary tract infection)    Past Surgical History:  Procedure Laterality Date  . BREAST BIOPSY    . BREAST CYST ASPIRATION Right 2010   NEG  . CESAREAN SECTION     x 1    Family History  Problem Relation Age of Onset  . Dementia Mother        onset 56 died 66  . Alzheimer's disease Mother   . Hyperlipidemia Mother   . Gout Father   . Heart disease Father        MI age 24   . Other Father        gout  . Deep vein thrombosis Father   . Arthritis Father   .  Asthma Sister   . Obesity Brother   . Arthritis Brother        hip replacement   . Heart disease Son        ablation heart d/o   . Asthma Son   . Breast cancer Neg Hx    Social History   Socioeconomic History  . Marital status: Married    Spouse name: Not on file  . Number of children: Not on file  . Years of education: Not on file  . Highest education level: Not on file  Occupational History  . Not on file  Tobacco Use  . Smoking status: Never Smoker  . Smokeless tobacco: Never Used  Substance and Sexual Activity  . Alcohol use: Yes    Comment: 2 wine glasses qhs  . Drug use: Not Currently  . Sexual activity: Not Currently  Other Topics Concern  . 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 Difficulty of Paying Living Expenses: Not on file  Food Insecurity:   . Worried About Programme researcher, broadcasting/film/video in the Last Year: Not on file  . Ran Out of Food in the Last Year: Not on file  Transportation Needs:   . Lack of Transportation (Medical): Not on file  . Lack of Transportation (Non-Medical): Not on file  Physical Activity:   . Days of Exercise per Week: Not on file  . Minutes of Exercise per Session: Not on file  Stress:   . Feeling of Stress : Not on file  Social Connections:   . Frequency of Communication with Friends and Family: Not on file  . Frequency of Social Gatherings with Friends and Family: Not on file  . Attends Religious Services: Not on file  . Active Member of Clubs or Organizations: Not on file  . Attends Banker Meetings: Not on file  . Marital Status: Not on file  Intimate Partner Violence:   . Fear of Current or  Ex-Partner: Not on file  . Emotionally Abused: Not on file  . Physically Abused: Not on file  . Sexually Abused: Not on file   Current Meds  Medication Sig  . ALPRAZolam (XANAX) 0.25 MG tablet Take 1 tablet (0.25 mg total) by mouth daily as needed for anxiety.  Marland Kitchen CALCIUM PO Take by mouth.  . Multiple Vitamin (MULTIVITAMIN) tablet Take 1 tablet by mouth daily.  . SKYRIZI, 150 MG DOSE, 75 MG/0.83ML PSKT   . valACYclovir (VALTREX) 500 MG tablet Take 1 tablet (500 mg total) by mouth 2 (two) times daily. X 3-7 days prn outbreak  . VITAMIN E PO Take by mouth.   No Known Allergies No results found for this or any previous visit (from the past 2160 hour(s)). Objective  Body mass index is 23.7 kg/m. Wt Readings from Last 3 Encounters:  09/21/20 158 lb 3.2 oz (71.8 kg)  05/09/20 152 lb 12.8 oz (69.3 kg)  02/09/20 156 lb (70.8 kg)   Temp Readings from Last 3 Encounters:  09/21/20 99.1 F (37.3 C) (Oral)  05/09/20 98.3 F (36.8 C) (Oral)  02/09/20 (!) 97.4 F (36.3 C) (Temporal)   BP Readings from Last 3 Encounters:  09/21/20 116/80  05/09/20 100/70  02/09/20 110/74   Pulse Readings from Last 3 Encounters:  09/21/20 81  05/09/20 (!) 50  02/09/20 84    Physical Exam Vitals and nursing note reviewed.  Constitutional:      Appearance: Normal appearance. She is well-developed and well-groomed.  HENT:     Head: Normocephalic and atraumatic.  Eyes:     Conjunctiva/sclera: Conjunctivae normal.     Pupils: Pupils are equal, round, and reactive to light.  Cardiovascular:     Rate and Rhythm: Normal rate and regular rhythm.     Heart sounds: Normal heart sounds. No murmur heard.   Pulmonary:     Effort: Pulmonary effort is normal.     Breath sounds: Normal breath sounds.  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  Neurological:     General: No focal deficit present.     Mental Status: She is alert and oriented to  person, place, and time. Mental status is at baseline.     Gait: Gait normal.  Psychiatric:        Attention and Perception: Attention and perception normal.        Mood and Affect: Mood and affect normal.        Speech: Speech normal.        Behavior: Behavior normal. Behavior is cooperative.        Thought Content: Thought content normal.        Cognition and Memory: Cognition and memory normal.        Judgment: Judgment normal.     Assessment  Plan  Annual physical exam - Fasting labs today  flu shotutd Tdap 11/27/16 covid vx 2/2pfizer, moderna 1 09/20/20 (did not mean to get had [facility]) covid 05/2020 + son gave to her  Pt wants to get shingrix in our officedisc with pt disc with [facility] Dr. Archie Balboa to see if ok with being on cosenyntex  Mammogram10/10/2018 and breast exam12/15/21   DEXA 12/03/16 normal   Colonoscopy 03/04/13 2 hyperplastic polyps f/u in 10 years   Pap due last in 01/12/2015 negative negative HPV -prev used premarin vaginal atrophy -h/o abnormal pap remotely  -pap11/13/2020 negative  Skin appt 9/2021Dr. Sharlee Blew Smoker 6 months in college  rec healthy diet and exercise   Cervicalgia - Plan: [facility] Cervical Spine Complete Given neck exercises to do   Anxiety and depression - Plan: citalopram (CELEXA) 10 MG tablet  F/y in 3-4 months Pt will sch therapy as well    Provider: Dr. French Ana McLean-Scocuzza-Internal Medicine

## 2020-09-25 NOTE — Unmapped (Signed)
Denise Richards reports her psoriasis is completely clear, no need for topical steroids. She got COVID back in August, but no other recent infections.     [facility] Clinical Assessment & Refill Coordination Note    Denise Richards, DOB: 05/18/1961  Phone: [phone] (work)    All above HIPAA information was verified with patient.     Was a Nurse, learning disability used for this call? No    Specialty Medication(s):   Inflammatory Disorders: Skyrizi     Current Outpatient Medications   Medication Sig Dispense Refill   ??? ALPRAZolam (XANAX) 0.25 MG tablet take 1 tablet by mouth once daily if needed for anxiety  0   ??? clobetasol-emollient 0.05 % topical foam Apply to scalp for psoriasis once to twice daily as needed 100 g 5   ??? risankizumab-rzaa (SKYRIZI) 150mg /1.79mL(75 mg/0.83 mL x2) SyKt Inject the contents of 2 syringes (150mg  total) under the skin at weeks 0 and 4, then once every 12 weeks thereafter. 4 each 1   ??? risankizumab-rzaa (SKYRIZI) 150 mg/mL Syrg Inject the contents of 1 syringe (150 mg) under the skin every 12 weeks 1 mL 3   ??? triamcinolone (KENALOG) 0.1 % ointment Apply twice a day to affected areas 454 g 3     No current facility-administered medications for this visit.        Changes to medications: Denise Richards reports no changes at this time.    No Known Allergies    Changes to allergies: No    SPECIALTY MEDICATION ADHERENCE     Skyrizi - 0 left  Medication Adherence    Patient reported X missed doses in the last month: 0  Specialty Medication: Skyrizi  Patient is on additional specialty medications: No          Specialty medication(s) dose(s) confirmed: Regimen is correct and unchanged.     Are there any concerns with adherence? No    Adherence counseling provided? Not needed    CLINICAL MANAGEMENT AND INTERVENTION      Clinical Benefit Assessment:    Do you feel the medicine is effective or helping your condition? Yes    Clinical Benefit counseling provided? Not needed    Adverse Effects Assessment:    Are you experiencing any side effects? No    Are you experiencing difficulty administering your medicine? No    Quality of Life Assessment:    How many days over the past month did your psoriasis  keep you from your normal activities? For example, brushing your teeth or getting up in the morning. 0    Have you discussed this with your provider? Not needed    Therapy Appropriateness:    Is therapy appropriate? Yes, therapy is appropriate and should be continued    DISEASE/MEDICATION-SPECIFIC INFORMATION      For patients on injectable medications: Patient currently has 0 doses left.  Next injection is scheduled for 12/21.    PATIENT SPECIFIC NEEDS     - Does the patient have any physical, cognitive, or cultural barriers? No    - Is the patient high risk? No    - Does the patient require a Care Management Plan? No     - Does the patient require physician intervention or other additional services (i.e. nutrition, smoking cessation, social work)? No      SHIPPING     Specialty Medication(s) to be Shipped:   Inflammatory Disorders: Skyrizi    Other medication(s) to be shipped: No additional medications  requested for fill at this time     Changes to insurance: No    Delivery Scheduled: Yes, Expected medication delivery date: Tues, Dec 14.     Medication will be delivered via Same Day Courier to the confirmed prescription address in [facility].    The patient will receive a drug information handout for each medication shipped and additional FDA Medication Guides as required.  Verified that patient has previously received a Conservation officer, historic buildings.    All of the patient's questions and concerns have been addressed.    Denise Richards   [facility] Specialty Pharmacist

## 2020-10-02 MED FILL — SKYRIZI 150 MG/ML SUBCUTANEOUS SYRINGE: 84 days supply | Qty: 1 | Fill #1 | Status: AC

## 2020-10-02 MED FILL — SKYRIZI 150 MG/ML SUBCUTANEOUS SYRINGE: 84 days supply | Qty: 1 | Fill #1

## 2020-10-03 ENCOUNTER — Other Ambulatory Visit: Payer: Self-pay

## 2020-10-03 ENCOUNTER — Ambulatory Visit
Admission: RE | Admit: 2020-10-03 | Discharge: 2020-10-03 | Disposition: A | Discharge status: Home / Self Care | Payer: BC Managed Care – PPO | Source: Ambulatory Visit | Attending: Internal Medicine | Admitting: Internal Medicine

## 2020-10-03 DIAGNOSIS — Z1231 Encounter for screening mammogram for malignant neoplasm of breast: Secondary | ICD-10-CM | POA: Diagnosis not present

## 2020-12-19 NOTE — Unmapped (Signed)
[facility] Refill Coordination Note    Specialty Medication(s) to be Shipped:   Inflammatory Disorders: Skyrizi    Other medication(s) to be shipped: No additional medications requested for fill at this time     Denise Richards, DOB: [birth year]  Phone: [phone] (work)      All above HIPAA information was verified with patient.     Was a Nurse, learning disability used for this call? No    Completed refill call assessment today to schedule patient's medication shipment from the [facility] [phone]).       Specialty medication(s) and dose(s) confirmed: Regimen is correct and unchanged.   Changes to medications: Denise Richards reports no changes at this time.  Changes to insurance: No  Questions for the pharmacist: No    Confirmed patient received Welcome Packet with first shipment. The patient will receive a drug information handout for each medication shipped and additional FDA Medication Guides as required.       DISEASE/MEDICATION-SPECIFIC INFORMATION        For patients on injectable medications: Patient currently has 0 doses left.  Next injection is scheduled for 01/08/2021.    SPECIALTY MEDICATION ADHERENCE     Medication Adherence    Patient reported X missed doses in the last month: 0  Specialty Medication: Skyrizi 150 mg/ml  Patient is on additional specialty medications: No  Any gaps in refill history greater than 2 weeks in the last 3 months: no  Demonstrates understanding of importance of adherence: yes  Informant: patient  Reliability of informant: reliable  Confirmed plan for next specialty medication refill: delivery by pharmacy  Refills needed for supportive medications: not needed                      SHIPPING     Shipping address confirmed in Epic.     Delivery Scheduled: Yes, Expected medication delivery date: 12/25/2020.     Medication will be delivered via Same Day Courier to the prescription address in Epic [facility].    Johnchristopher Sarvis D Trystyn Sitts   [facility] Specialty Technician

## 2020-12-25 MED FILL — SKYRIZI 150 MG/ML SUBCUTANEOUS SYRINGE: 84 days supply | Qty: 1 | Fill #2

## 2021-01-01 DIAGNOSIS — Z5112 Encounter for antineoplastic immunotherapy: Secondary | ICD-10-CM | POA: Diagnosis not present

## 2021-01-17 ENCOUNTER — Encounter: Payer: Self-pay | Admitting: Internal Medicine

## 2021-01-17 ENCOUNTER — Telehealth: Payer: BC Managed Care – PPO | Admitting: Internal Medicine

## 2021-01-17 VITALS — Ht 68.0 in | Wt 152.0 lb

## 2021-01-17 DIAGNOSIS — J309 Allergic rhinitis, unspecified: Secondary | ICD-10-CM

## 2021-01-17 DIAGNOSIS — R059 Cough, unspecified: Secondary | ICD-10-CM

## 2021-01-17 NOTE — Progress Notes (Signed)
Telephone Note  I connected with Jennifer Mcintosh  on 01/17/21 at  1:00 PM EDT by telephone  and verified that I am speaking with the correct person using two identifiers.  Location patient: home, [place] Location provider:work or home office Persons participating in the virtual visit: patient, provider  I discussed the limitations of evaluation and management by telemedicine and the availability of in person appointments. The patient expressed understanding and agreed to proceed.   HPI: Spoke with pt briefly she got off of the video and when call she stated she is feeling better she wsa c/w bronchitis with cough with green mucous, chest congestion and sore throat x 3 days or this week but feeling better will call back if worsening  Given info where to get covid testing    -COVID-19 vaccine status: 3/3  ROS: See pertinent positives and negatives per HPI.  Past Medical History:  Diagnosis Date  . Allergy    avoids antihistamines 2/2 intolerance   . Anxiety   . Breast cyst    drained by Dr. Micah Noel in the past   . Depression    took meds in 1998 x few months per Dr. Thedore Mins notes  . Psoriasis   . UTI (urinary tract infection)     Past Surgical History:  Procedure Laterality Date  . BREAST CYST ASPIRATION Right 2010   NEG  . CESAREAN SECTION     x 1      Current Outpatient Medications:  .  ALPRAZolam (XANAX) 0.25 MG tablet, Take 1 tablet (0.25 mg total) by mouth daily as needed for anxiety., Disp: 30 tablet, Rfl: 5 .  CALCIUM PO, Take by mouth., Disp: , Rfl:  .  citalopram (CELEXA) 10 MG tablet, Take 1 tablet (10 mg total) by mouth daily. In am, Disp: 90 tablet, Rfl: 3 .  Multiple Vitamin (MULTIVITAMIN) tablet, Take 1 tablet by mouth daily., Disp: , Rfl:  .  SKYRIZI, 150 MG DOSE, 75 MG/0.83ML PSKT, , Disp: , Rfl:  .  valACYclovir (VALTREX) 500 MG tablet, Take 1 tablet (500 mg total) by mouth 2 (two) times daily. X 3-7 days prn outbreak, Disp: 90 tablet, Rfl: 3 .  VITAMIN E PO, Take by  mouth., Disp: , Rfl:   EXAM:  VITALS per patient if applicable:  GENERAL: alert, oriented, appears well and in no acute distress  PSYCH/NEURO: pleasant and cooperative, no obvious depression or anxiety, speech and thought processing grossly intact  ASSESSMENT AND PLAN:  Discussed the following assessment and plan:  Allergic rhinitis, unspecified seasonality, unspecified trigger  Cough  Spoke with pt briefly she got off of the video and when call she stated she is feeling better she wsa c/w bronchitis with cough with green mucous, chest congestion and sore throat x 3 days or this week but feeling better will call back if worsening  Given info where to get covid testing  Call if appointment needed   over the counter meds which may help you feel better and if needed call back for an appointment:  Mucinex dm green label for cough or Robitussin  Vitamin C 1000 mg daily.  Vitamin D3 4000 Iu (units) daily.  Zinc 100 mg daily.  Quercetin 250-500 mg 2 times per day  Elderberry  Oil of oregano  cepacol or chloroseptic spray or warm salt water gargles sore throat Warm tea with honey and lemon  Hydration  Try to eat though you dont feel like it  Tylenol or Advil if headaches/body aches, fever  Nasal saline, Flonase nose sprays If sneezing, cough, nasal drip runny nose allegra, zyrtec, claritin or xyzal antihistamine over the counter   If short of breath Monitor pulse oximeter, buy from [facility] if oxygen is less than 90 please go to the hospital.      Are you feeling really sick? Shortness of breath, cough, chest pain?, dizziness? Confusion  If so let me know  If worsening, go to hospital or [facility] clinic Urgent care for further treatment  [facility] and [facility] both do covid testing as well          -we discussed possible serious and likely etiologies, options for evaluation and workup, limitations of telemedicine visit vs in person  visit, treatment, treatment risks and precautions.   I discussed the assessment and treatment plan with the patient. The patient was provided an opportunity to ask questions and all were answered. The patient agreed with the plan and demonstrated an understanding of the instructions.    No charge Bevelyn Buckles, MD

## 2021-01-17 NOTE — Patient Instructions (Signed)
There is no medication other than over the counter meds which may help you feel better and if needed call back for an appointment:  Mucinex dm green label for cough or Robitussin  Vitamin C 1000 mg daily.  Vitamin D3 4000 Iu (units) daily.  Zinc 100 mg daily.  Quercetin 250-500 mg 2 times per day   Elderberry  Oil of oregano  cepacol or chloroseptic spray or warm salt water gargles sore throat Warm tea with honey and lemon  Hydration  Try to eat though you dont feel like it   Tylenol or Advil if headaches/body aches, fever Nasal saline, Flonase nose sprays If sneezing, cough, nasal drip runny nose allegra, zyrtec, claritin or xyzal antihistamine over the counter    If short of breath Monitor pulse oximeter, buy from [place] if oxygen is less than 90 please go to the hospital.        Are you feeling really sick? Shortness of breath, cough, chest pain?, dizziness? Confusion   If so let me know  If worsening, go to hospital or [facility] clinic Urgent care for further treatment  [facility] and [facility] both do covid testing as well       Allergic Rhinitis, Adult  Allergic rhinitis is an allergic reaction that affects the mucous membrane inside the nose. The mucous membrane is the tissue that produces mucus. There are two types of allergic rhinitis:  Seasonal. This type is also called hay fever and happens only during certain seasons.  Perennial. This type can happen at any time of the year. Allergic rhinitis cannot be spread from person to person. This condition can be mild, moderate, or severe. It can develop at any age and may be outgrown. What are the causes? This condition is caused by allergens. These are things that can cause an allergic reaction. Allergens may differ for seasonal allergic rhinitis and perennial allergic rhinitis.  Seasonal allergic rhinitis is triggered by pollen. Pollen can come from grasses, trees, and weeds.  Perennial allergic  rhinitis may be triggered by: ? Dust mites. ? Proteins in a pet's urine, saliva, or dander. Dander is dead skin cells from a pet. ? Smoke, mold, or car fumes. What increases the risk? You are more likely to develop this condition if you have a family history of allergies or other conditions related to allergies, including:  Allergic conjunctivitis. This is inflammation of parts of the eyes and eyelids.  Asthma. This condition affects the lungs and makes it hard to breathe.  Atopic dermatitis or eczema. This is long term (chronic) inflammation of the skin.  Food allergies. What are the signs or symptoms? Symptoms of this condition include:  Sneezing or coughing.  A stuffy nose (nasal congestion), itchy nose, or nasal discharge.  Itchy eyes and tearing of the eyes.  A feeling of mucus dripping down the back of your throat (postnasal drip).  Trouble sleeping.  Tiredness or fatigue.  Headache.  Sore throat. How is this diagnosed? This condition may be diagnosed with your symptoms, medical history, and physical exam. Your health care provider may check for related conditions, such as:  Asthma.  Pink eye. This is eye inflammation caused by infection (conjunctivitis).  Ear infection.  Upper respiratory infection. This is an infection in the nose, throat, or upper airways. You may also have tests to find out which allergens trigger your symptoms. These may include skin tests or blood tests. How is this treated? There is no cure for this  condition, but treatment can help control symptoms. Treatment may include:  Taking medicines that block allergy symptoms, such as corticosteroids and antihistamines. Medicine may be given as a shot, nasal spray, or pill.  Avoiding any allergens.  Being exposed again and again to tiny amounts of allergens to help you build a defense against allergens (immunotherapy). This is done if other treatments have not helped. It may include: ? Allergy  shots. These are injected medicines that have small amounts of allergen in them. ? Sublingual immunotherapy. This involves taking small doses of a medicine with allergen in it under your tongue. If these treatments do not work, your health care provider may prescribe newer, stronger medicines. Follow these instructions at home: Avoiding allergens Find out what you are allergic to and avoid those allergens. These are some things you can do to help avoid allergens:  If you have perennial allergies: ? Replace carpet with wood, tile, or vinyl flooring. Carpet can trap dander and dust. ? Do not smoke. Do not allow smoking in your home. ? Change your heating and air conditioning filters at least once a month.  If you have seasonal allergies, take these steps during allergy season: ? Keep windows closed as much as possible. ? Plan outdoor activities when pollen counts are lowest. Check pollen counts before you plan outdoor activities. ? When coming indoors, change clothing and shower before sitting on furniture or bedding.  If you have a pet in the house that produces allergens: ? Keep the pet out of the bedroom. ? Vacuum, sweep, and dust regularly. General instructions  Take over-the-counter and prescription medicines only as told by your health care provider.  Drink enough fluid to keep your urine pale yellow.  Keep all follow-up visits as told by your health care provider. This is important. Where to find more information  American Academy of Allergy, Asthma & Immunology: www.aaaai.org Contact a health care provider if:  You have a fever.  You develop a cough that does not go away.  You make whistling sounds when you breathe (wheeze).  Your symptoms slow you down or stop you from doing your normal activities each day. Get help right away if:  You have shortness of breath. This symptom may represent a serious problem that is an emergency. Do not wait to see if the symptom will go  away. Get medical help right away. Call your local emergency services (911 in the [place] Do not drive yourself to the hospital. Summary  Allergic rhinitis may be managed by taking medicines as directed and avoiding allergens.  If you have seasonal allergies, keep windows closed as much as possible during allergy season.  Contact your health care provider if you develop a fever or a cough that does not go away. This information is not intended to replace advice given to you by your health care provider. Make sure you discuss any questions you have with your health care provider. Document Revised: 11/25/2019 Document Reviewed: 10/04/2019 Elsevier Patient Education  2021 Elsevier Inc.  Postnasal Drip Postnasal drip is the feeling of mucus going down the back of your throat. Mucus is a slimy substance that moistens and cleans your nose and throat, as well as the air pockets in face bones near your forehead and cheeks (sinuses). Small amounts of mucus pass from your nose and sinuses down the back of your throat all the time. This is normal. When you produce too much mucus or the mucus gets too thick, you can feel it.  Some common causes of postnasal drip include:  Having more mucus because of: ? A cold or the flu. ? Allergies. ? Cold air. ? Certain medicines.  Having more mucus that is thicker because of: ? A sinus or nasal infection. ? Dry air. ? A food allergy. Follow these instructions at home: Relieving discomfort  Gargle with a salt-water mixture 3-4 times a day or as needed. To make a salt-water mixture, completely dissolve -1 tsp of salt in 1 cup of warm water.  If the air in your home is dry, use a humidifier to add moisture to the air.  Use a saline spray or container (neti pot) to flush out the nose (nasal irrigation). These methods can help clear away mucus and keep the nasal passages moist.   General instructions  Take over-the-counter and prescription medicines only as  told by your health care provider.  Follow instructions from your health care provider about eating or drinking restrictions. You may need to avoid caffeine.  Avoid things that you know you are allergic to (allergens), like dust, mold, pollen, pets, or certain foods.  Drink enough fluid to keep your urine pale yellow.  Keep all follow-up visits as told by your health care provider. This is important. Contact a health care provider if:  You have a fever.  You have a sore throat.  You have difficulty swallowing.  You have headache.  You have sinus pain.  You have a cough that does not go away.  The mucus from your nose becomes thick and is green or yellow in color.  You have cold or flu symptoms that last more than 10 days. Summary  Postnasal drip is the feeling of mucus going down the back of your throat.  If your health care provider approves, use nasal irrigation or a nasal spray 2?4 times a day.  Avoid things that you know you are allergic to (allergens), like dust, mold, pollen, pets, or certain foods. This information is not intended to replace advice given to you by your health care provider. Make sure you discuss any questions you have with your health care provider. Document Revised: 07/17/2020 Document Reviewed: 07/17/2020 Elsevier Patient Education  2021 [employer].

## 2021-01-22 ENCOUNTER — Ambulatory Visit: Payer: BC Managed Care – PPO | Admitting: Internal Medicine

## 2021-01-23 ENCOUNTER — Ambulatory Visit
Admission: EM | Admit: 2021-01-23 | Discharge: 2021-01-23 | Disposition: A | Discharge status: Home / Self Care | Payer: BC Managed Care – PPO | Attending: Family Medicine | Admitting: Family Medicine

## 2021-01-23 ENCOUNTER — Encounter: Payer: Self-pay | Admitting: Emergency Medicine

## 2021-01-23 ENCOUNTER — Other Ambulatory Visit: Payer: Self-pay

## 2021-01-23 DIAGNOSIS — J0140 Acute pansinusitis, unspecified: Secondary | ICD-10-CM

## 2021-01-23 MED ORDER — AMOXICILLIN 875 MG PO TABS: 875.0000 mg | ORAL_TABLET | Freq: Two times a day (BID) | ORAL | 0 refills | Status: AC

## 2021-01-23 NOTE — ED Triage Notes (Signed)
Patient c/o productive cough w/ " green" sputum and horsness x 1 week.   Patient endorses nasal congestion and scratchy throat.   Patient endorses a temperature of 99.9 F a couple night ago.   Patient denies headache and ear pain.   Patient endorses sinus pressure.   Patient has used cough drops, sudafed, and water with some relief of symptoms.

## 2021-01-23 NOTE — ED Provider Notes (Signed)
[facility]    CSN: [id number] Arrival date & time: 01/23/21  1440      History   Chief Complaint Chief Complaint  Patient presents with  . Cough  . Hoarse    HPI Jennifer Mcintosh is a 60 y.o. female.   HPI  Patient presents with symptoms cough, headache, nasal congestion sore throat   Patient presents with concern for possible sinus infection. Onset: >7 days. This is a recurrent problem that patient reports occurring at least one time per year. Endorse facial pressure, HA, nasal congestion/draining and throat soreness. Attempted relief with OTC medication without any relief of symptoms. Afebrile.  Remainder of Review of Systems negative except as noted in the HPI.   Past Medical History:  Diagnosis Date  . Allergy    avoids antihistamines 2/2 intolerance   . Anxiety   . Breast cyst    drained by Dr. Micah Noel in the past   . Depression    took meds in 1998 x few months per Dr. Thedore Mins notes  . Psoriasis   . UTI (urinary tract infection)     Patient Active Problem List   Diagnosis Date Noted  . PVC's (premature ventricular contractions) 02/09/2020  . Chronic pain of right ankle 02/09/2020  . Annual physical exam 09/02/2019  . Hemorrhoids 09/02/2019  . Acute pain of left shoulder 09/02/2019  . Cervicalgia 09/02/2019  . Psoriasis 06/02/2019  . Anxiety 06/02/2019  . 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 Postmenopausal 11/27/2016  . Acute frontal sinusitis 04/19/2014    Past Surgical History:  Procedure Laterality Date  . BREAST CYST ASPIRATION Right 2010   NEG  . CESAREAN SECTION     x 1     OB History   No obstetric history on file.      Home Medications    Prior to Admission medications   Medication Sig Start Date End Date Taking? Authorizing Provider  amoxicillin (AMOXIL) 875 MG tablet Take 1 tablet (875 mg total) by mouth 2 (two) times daily for 7 days. 01/23/21 01/30/21 Yes Bing Neighbors, FNP  ALPRAZolam Prudy Feeler) 0.25 MG tablet Take 1 tablet  (0.25 mg total) by mouth daily as needed for anxiety. 04/20/20   McLean-Scocuzza, Pasty Spillers, MD  CALCIUM PO Take by mouth.    [provider]  citalopram (CELEXA) 10 MG tablet Take 1 tablet (10 mg total) by mouth daily. In am 09/21/20   McLean-Scocuzza, Pasty Spillers, MD  Multiple Vitamin (MULTIVITAMIN) tablet Take 1 tablet by mouth daily.    [provider]  SKYRIZI, 150 MG DOSE, 75 MG/0.83ML PSKT  03/28/20   [provider]  valACYclovir (VALTREX) 500 MG tablet Take 1 tablet (500 mg total) by mouth 2 (two) times daily. X 3-7 days prn outbreak 06/02/19   McLean-Scocuzza, Pasty Spillers, MD  VITAMIN E PO Take by mouth.    [provider]    Family History Family History  Problem Relation Age of Onset  . Dementia Mother        onset 57 died 28  . Alzheimer's disease Mother   . Hyperlipidemia Mother   . Gout Father   . Heart disease Father        MI age 59   . Other Father        gout  . Deep vein thrombosis Father   . Arthritis Father   . Asthma Sister   . Obesity Brother   . Arthritis Brother  hip replacement   . Heart disease Son        ablation heart d/o   . Asthma Son   . Breast cancer Neg Hx     Social History Social History   Tobacco Use  . Smoking status: Never Smoker  . Smokeless tobacco: Never Used  Substance Use Topics  . Alcohol use: Yes    Comment: 2 wine glasses qhs  . Drug use: Not Currently     Allergies   Patient has no known allergies.   Review of Systems Review of Systems Pertinent negatives listed in HPI  Physical Exam Triage Vital Signs ED Triage Vitals  Enc Vitals Group     BP 01/23/21 1449 130/75     Pulse Rate 01/23/21 1449 74     Resp 01/23/21 1449 18     Temp 01/23/21 1449 98.1 F (36.7 C)     Temp Source 01/23/21 1449 Oral     SpO2 01/23/21 1449 94 %     Weight --      Height --      Head Circumference --      Peak Flow --      Pain Score 01/23/21 1453 0     Pain Loc --      Pain Edu? --      Excl.  in GC? --    No data found.  Updated Vital Signs BP 130/75 (BP Location: Left Arm)   Pulse 74   Temp 98.1 F (36.7 C) (Oral)   Resp 18   SpO2 94%   Visual Acuity Right Eye Distance:   Left Eye Distance:   Bilateral Distance:    Right Eye Near:   Left Eye Near:    Bilateral Near:     Physical Exam  General Appearance:    Alert, cooperative, no distress  HENT:   Normocephalic, ears normal, nares mucosal edema with congestion, rhinorrhea, oropharynx normal    Eyes:    PERRL, conjunctiva/corneas clear, EOM's intact       Lungs:     Clear to auscultation bilaterally, respirations unlabored  Heart:    Regular rate and rhythm  Neurologic:   Awake, alert, oriented x 3. No apparent focal neurological           defect.      UC Treatments / Results  Labs (all labs ordered are listed, but only abnormal results are displayed) Labs Reviewed - No data to display  EKG   Radiology No results found.  Procedures Procedures (including critical care time)  Medications Ordered in UC Medications - No data to display  Initial Impression / Assessment and Plan / UC Course  I have reviewed the triage vital signs and the nursing notes.  Pertinent labs & imaging results that were available during my care of the patient were reviewed by me and considered in my medical decision making (see chart for details).      Start Amoxicillin 875 mg twice daily x 7 days.  OTC medication for cough. OTC antihistamine for nasal congestion. Follow-up with PCP as needed. Final Clinical Impressions(s) / UC Diagnoses   Final diagnoses:  Acute non-recurrent pansinusitis   Discharge Instructions   None    ED Prescriptions    Medication Sig Dispense Auth. Provider   amoxicillin (AMOXIL) 875 MG tablet Take 1 tablet (875 mg total) by mouth 2 (two) times daily for 7 days. 14 tablet Bing Neighbors, FNP     PDMP not reviewed this  encounter.   Bing Neighbors, FNP 01/25/21 [phone]

## 2021-03-20 NOTE — Unmapped (Signed)
Denise Richards reports her psoriasis continues to be completely clear, no need for topical steroids. She's had no adverse effects, and no recent infections.    Her insurance will be changing as of 7/1, and she'll plan to call us once she receives her new card.       [facility] Clinical Assessment & Refill Coordination Note    Denise Richards, DOB: Jan 17, 1961  Phone: [phone] (work)    All above HIPAA information was verified with patient.     Was a Nurse, learning disability used for this call? No    Specialty Medication(s):   Inflammatory Disorders: Skyrizi     Current Outpatient Medications   Medication Sig Dispense Refill   ??? ALPRAZolam (XANAX) 0.25 MG tablet take 1 tablet by mouth once daily if needed for anxiety  0   ??? clobetasol-emollient 0.05 % topical foam Apply to scalp for psoriasis once to twice daily as needed 100 g 5   ??? risankizumab-rzaa (SKYRIZI) 150mg /1.80mL(75 mg/0.83 mL x2) SyKt Inject the contents of 2 syringes (150mg  total) under the skin at weeks 0 and 4, then once every 12 weeks thereafter. 4 each 1   ??? risankizumab-rzaa (SKYRIZI) 150 mg/mL Syrg Inject the contents of 1 syringe (150 mg) under the skin every 12 weeks 1 mL 3   ??? triamcinolone (KENALOG) 0.1 % ointment Apply twice a day to affected areas 454 g 3     No current facility-administered medications for this visit.        Changes to medications: Denise Richards reports no changes at this time.    No Known Allergies    Changes to allergies: No    SPECIALTY MEDICATION ADHERENCE     Skyrizi - 0 left  Medication Adherence    Patient reported X missed doses in the last month: 0  Specialty Medication: Skyrizi          Specialty medication(s) dose(s) confirmed: Regimen is correct and unchanged.     Are there any concerns with adherence? No    Adherence counseling provided? Not needed    CLINICAL MANAGEMENT AND INTERVENTION      Clinical Benefit Assessment:    Do you feel the medicine is effective or helping your condition? Yes    Clinical Benefit counseling provided? Not needed    Adverse Effects Assessment:    Are you experiencing any side effects? No    Are you experiencing difficulty administering your medicine? No    Quality of Life Assessment:    How many days over the past month did your psoriasis  keep you from your normal activities? For example, brushing your teeth or getting up in the morning. 0    Have you discussed this with your provider? Not needed    Acute Infection Status:    Acute infections noted within Epic:  No active infections    Patient reported infection: None    Therapy Appropriateness:    Is therapy appropriate? Yes, therapy is appropriate and should be continued    DISEASE/MEDICATION-SPECIFIC INFORMATION      For patients on injectable medications: Patient currently has 0 doses left.  Next injection is scheduled for 6/14.    PATIENT SPECIFIC NEEDS     - Does the patient have any physical, cognitive, or cultural barriers? No    - Is the patient high risk? No    - Does the patient require a Care Management Plan? No     - Does the patient require physician intervention  or other additional services (i.e. nutrition, smoking cessation, social work)? No      SHIPPING     Specialty Medication(s) to be Shipped:   Inflammatory Disorders: Skyrizi    Other medication(s) to be shipped: No additional medications requested for fill at this time     Changes to insurance: No    Delivery Scheduled: Yes, Expected medication delivery date: Wed, 6/8.     Medication will be delivered via Same Day Courier to the confirmed prescription address in [facility].    The patient will receive a drug information handout for each medication shipped and additional FDA Medication Guides as required.  Verified that patient has previously received a Conservation officer, historic buildings.    All of the patient's questions and concerns have been addressed.    Lanney Gins   [facility] Specialty Pharmacist

## 2021-03-27 MED FILL — SKYRIZI 150 MG/ML SUBCUTANEOUS SYRINGE: 84 days supply | Qty: 1 | Fill #3

## 2021-05-07 ENCOUNTER — Encounter: Payer: Self-pay | Admitting: Internal Medicine

## 2021-05-30 DIAGNOSIS — L409 Psoriasis, unspecified: Principal | ICD-10-CM

## 2021-05-30 MED ORDER — SKYRIZI 150 MG/ML SUBCUTANEOUS SYRINGE
SUBCUTANEOUS | 3 refills | 0.00000 days | Status: CP
Start: 2021-05-30 — End: ?

## 2021-05-30 NOTE — Unmapped (Signed)
Refill for skyrizi, please advise

## 2021-05-31 DIAGNOSIS — L409 Psoriasis, unspecified: Principal | ICD-10-CM

## 2021-06-06 DIAGNOSIS — L409 Psoriasis, unspecified: Principal | ICD-10-CM

## 2021-06-06 MED ORDER — SKYRIZI 150 MG/ML SUBCUTANEOUS SYRINGE
3 refills | 0 days | Status: CP
Start: 2021-06-06 — End: ?

## 2021-06-06 NOTE — Unmapped (Signed)
The [facility] has received the prescription(s) for Skyrizi 150mg /ml. The triage team has completed the benefits investigation and has determined that the patient is NOT able to fill this medication at the [facility] due to insurance plan limitations. Please see additional information below and re-route the prescription to the preferred pharmacy. Thank you.    PA Required: Approved    Specialty Pharmacy Required:  [facility] - Phone: [phone]

## 2021-06-06 NOTE — Unmapped (Signed)
Rx for [employer] sent to [facility] as directed by [employer]

## 2021-07-09 ENCOUNTER — Other Ambulatory Visit: Payer: Self-pay | Admitting: Internal Medicine

## 2021-07-10 ENCOUNTER — Telehealth: Payer: Self-pay | Admitting: Internal Medicine

## 2021-07-10 MED ORDER — VALACYCLOVIR HCL 500 MG PO TABS
ORAL_TABLET | ORAL | 0 refills | Status: DC
Start: 2021-07-10 — End: 2021-09-25

## 2021-07-10 NOTE — Telephone Encounter (Signed)
I refilled valtrex.  Accidentally sent in 2 prescriptions.  Please notify pharmacy that rx should be for #90 with no refills.  (Not 3 refills).

## 2021-07-10 NOTE — Telephone Encounter (Signed)
Called and spoke with the pharmacy. Confirmed the prescription for valtrex for 90 days with 0 refills.  Pharmacist cancelled the prescription with 3 refills.

## 2021-07-11 NOTE — Unmapped (Signed)
Specialty Medication(s): [employer]      Denise Richards has been dis-enrolled from the [facility] specialty pharmacy services due to a pharmacy change resulting from insurance limitations. The insurance company requires the patient fill at [facility].    Additional information provided to the patient: na/ new order has been forwarded to preferred pharmacy.    Lanney Gins  [facility] Specialty Pharmacist

## 2021-07-16 IMAGING — MG MM DIGITAL SCREENING BILAT W/ TOMO W/ CAD
8 series · 8 of 24 positions shown · non-contrast
Comparison: Previous exam(s).

CLINICAL DATA: Screening.

EXAM:
DIGITAL SCREENING BILATERAL MAMMOGRAM WITH TOMO AND CAD

[R CC synth-2D]
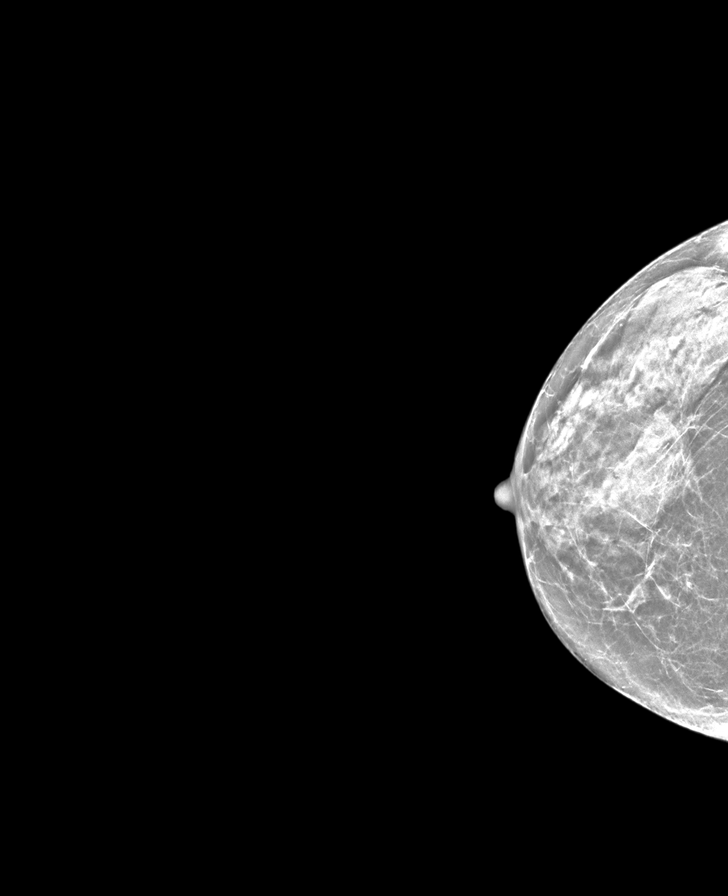

[R MLO synth-2D]
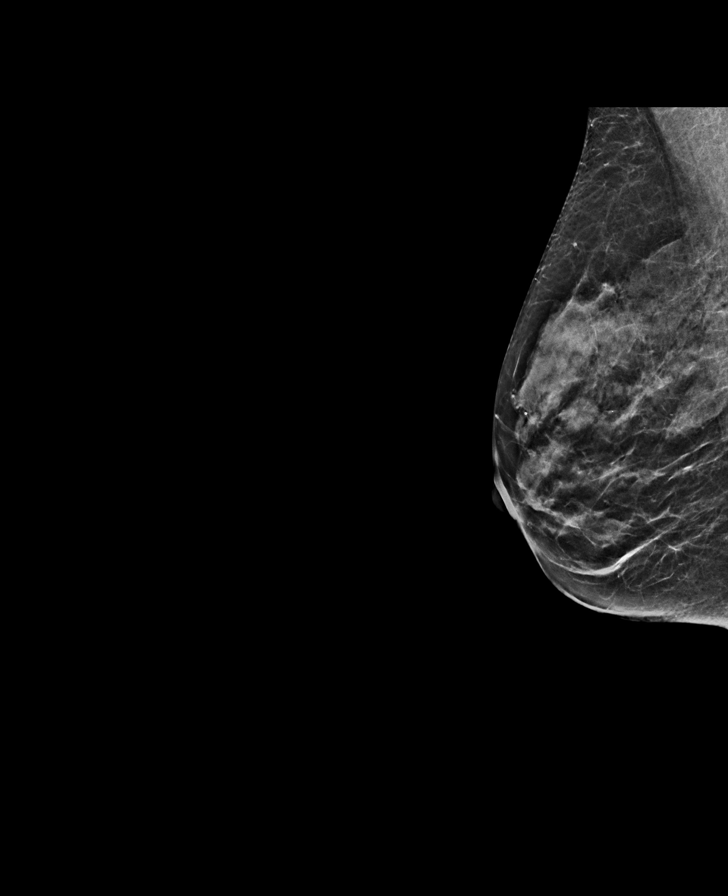

[L MLO synth-2D]
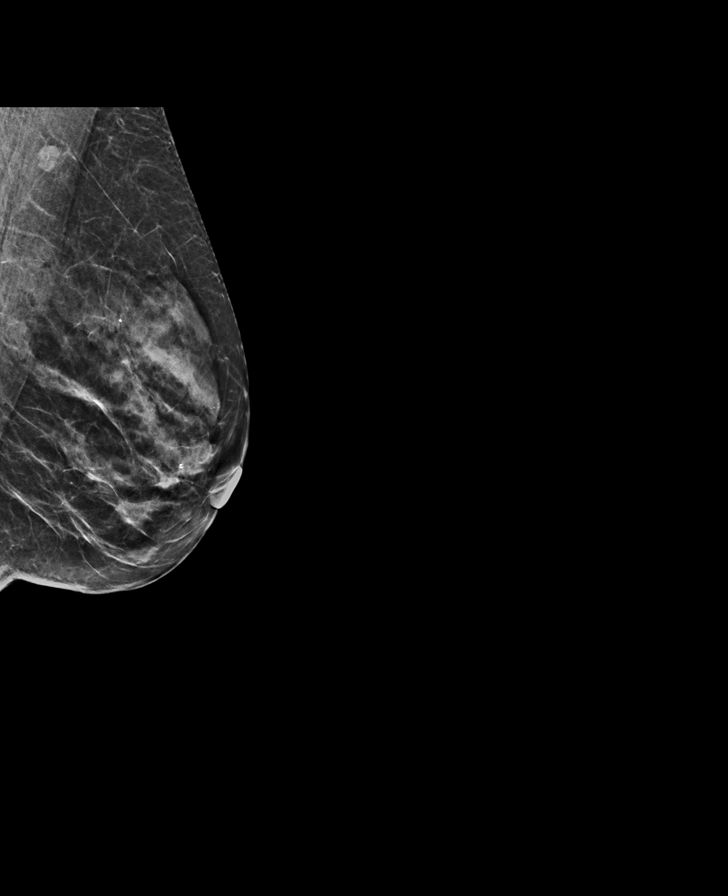

[L CC synth-2D]
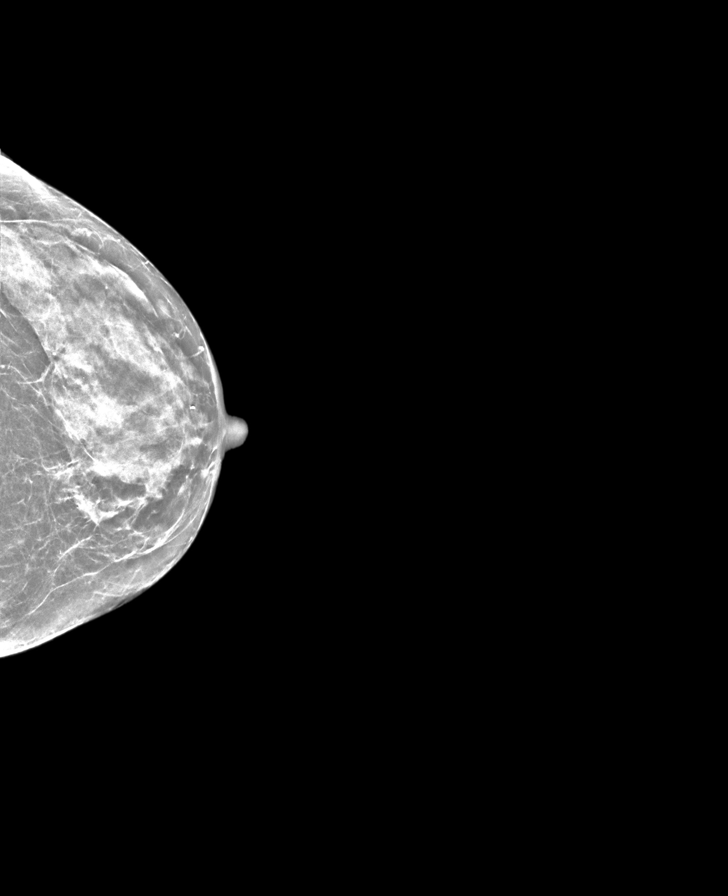

[L CC tomo · tomo slice 27/54.0]
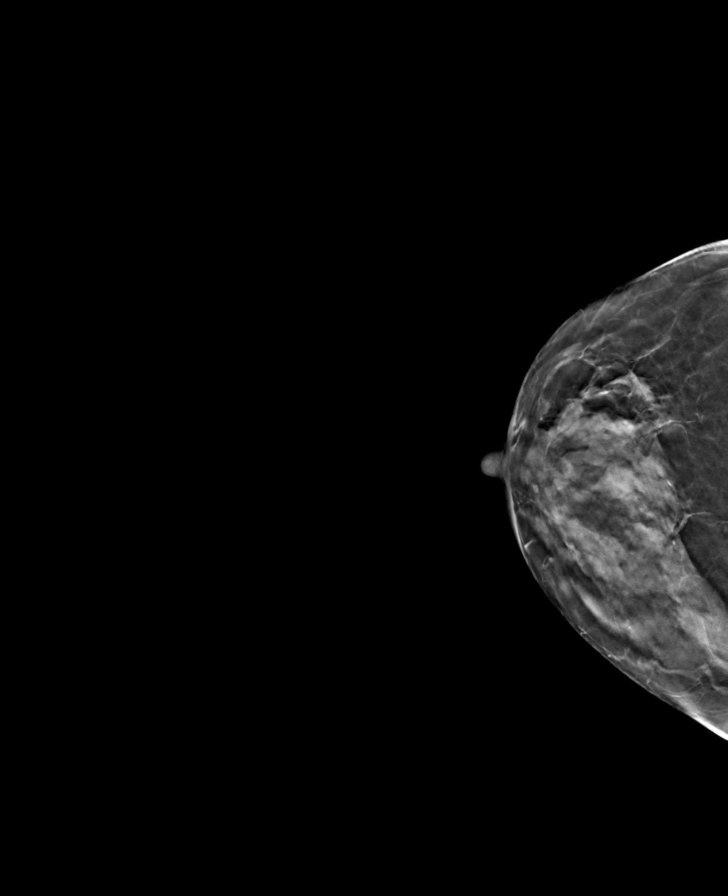

[L MLO tomo · tomo slice 27/52.0]
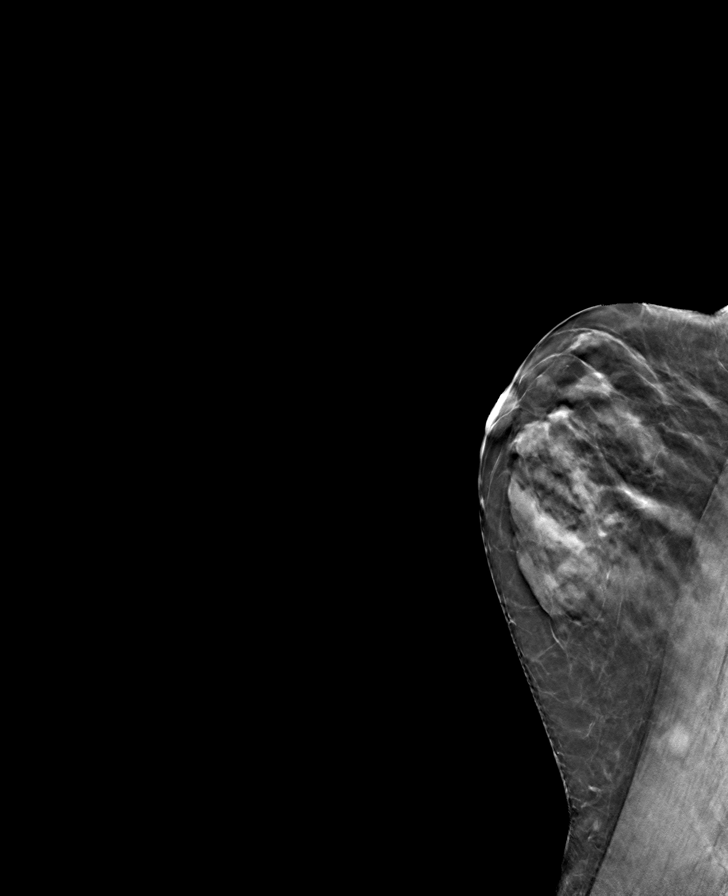

[R CC tomo · tomo slice 25/49.0]
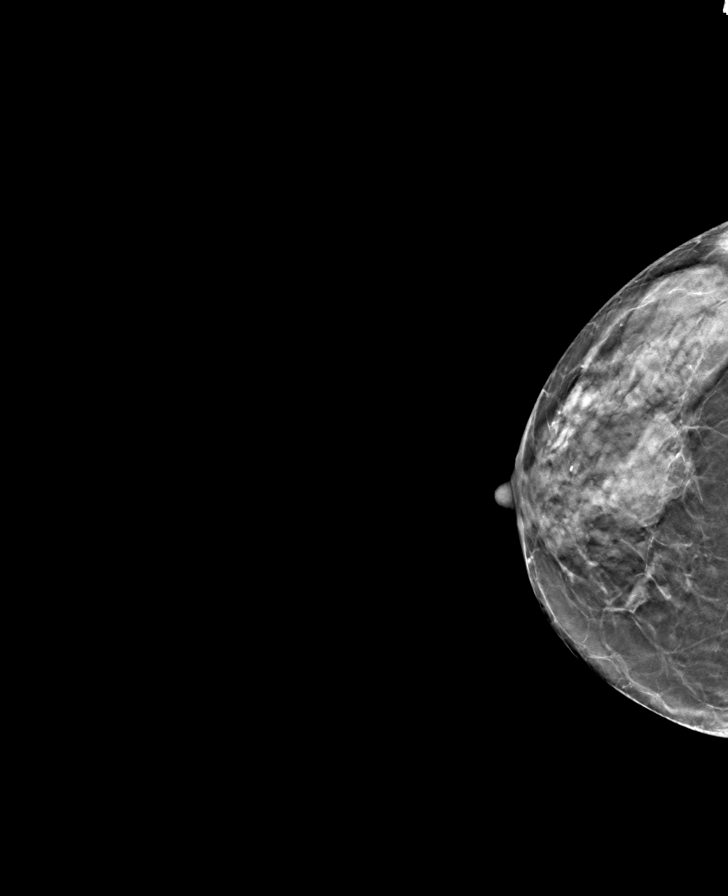

[R MLO tomo · tomo slice 26/51.0]
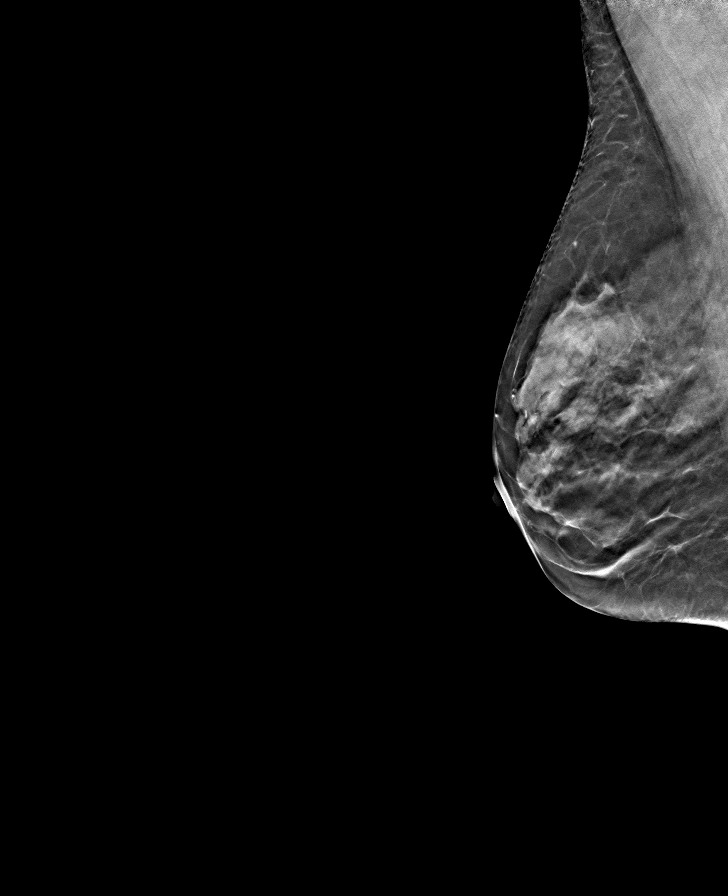

[8 of 24 positions shown; findings below may reference images not displayed]

ACR Breast Density Category c: The breast tissue is heterogeneously
dense, which may obscure small masses.
FINDINGS: There are no findings suspicious for malignancy. Images were
processed with CAD.
IMPRESSION: No mammographic evidence of malignancy. A result letter of this
screening mammogram will be mailed directly to the patient.

RECOMMENDATION:
Screening mammogram in one year. (Code:[id number]-[id number])

BI-RADS CATEGORY  1: Negative.

## 2021-07-25 ENCOUNTER — Ambulatory Visit: Admit: 2021-07-25 | Discharge: 2021-07-26 | Payer: PRIVATE HEALTH INSURANCE

## 2021-07-25 DIAGNOSIS — L578 Other skin changes due to chronic exposure to nonionizing radiation: Principal | ICD-10-CM

## 2021-07-25 DIAGNOSIS — L821 Other seborrheic keratosis: Principal | ICD-10-CM

## 2021-07-25 DIAGNOSIS — Z79899 Other long term (current) drug therapy: Principal | ICD-10-CM

## 2021-07-25 DIAGNOSIS — L409 Psoriasis, unspecified: Principal | ICD-10-CM

## 2021-08-06 ENCOUNTER — Ambulatory Visit (INDEPENDENT_AMBULATORY_CARE_PROVIDER_SITE_OTHER): Payer: 59 | Admitting: Family Medicine

## 2021-08-06 ENCOUNTER — Encounter: Payer: Self-pay | Admitting: Family Medicine

## 2021-08-06 ENCOUNTER — Other Ambulatory Visit: Payer: Self-pay

## 2021-08-06 VITALS — BP 102/60 | HR 70 | Temp 97.0°F | Ht 68.5 in | Wt 160.5 lb

## 2021-08-06 DIAGNOSIS — R300 Dysuria: Secondary | ICD-10-CM | POA: Diagnosis not present

## 2021-08-06 LAB — POC URINALSYSI DIPSTICK (AUTOMATED)
Bilirubin, UA: NEGATIVE
Blood, UA: NEGATIVE
Glucose, UA: NEGATIVE
Ketones, UA: NEGATIVE
Leukocytes, UA: NEGATIVE
Nitrite, UA: NEGATIVE
Protein, UA: POSITIVE — AB
Spec Grav, UA: 1.01 (ref 1.010–1.025)
Urobilinogen, UA: 0.2 E.U./dL
pH, UA: 6 (ref 5.0–8.0)

## 2021-08-06 NOTE — Progress Notes (Signed)
   Subjective:     Jennifer Mcintosh is a 60 y.o. female presenting for Dysuria (Week and a half )     Dysuria  Pertinent negatives include no chills, nausea or vomiting.   #Dysuria - burning w/ urination - x 10 days - some urgency - some frequency - which is improving - no blood in the urine - no nocturia - some hesitancy - treatment: nothing - no cranberry or azo   Review of Systems  Constitutional:  Negative for chills and fever.  Gastrointestinal:  Negative for abdominal pain, nausea and vomiting.  Genitourinary:  Positive for dysuria.    Social History   Tobacco Use  Smoking Status Never  Smokeless Tobacco Never        Objective:    BP Readings from Last 3 Encounters:  08/06/21 102/60  01/23/21 130/75  09/21/20 116/80   Wt Readings from Last 3 Encounters:  08/06/21 160 lb 8 oz (72.8 kg)  01/17/21 152 lb (68.9 kg)  09/21/20 158 lb 3.2 oz (71.8 kg)    BP 102/60   Pulse 70   Temp (!) 97 F (36.1 C) (Temporal)   Ht 5' 8.5" (1.74 m)   Wt 160 lb 8 oz (72.8 kg)   SpO2 97%   BMI 24.05 kg/m    Physical Exam Constitutional:      General: She is not in acute distress.    Appearance: She is well-developed. She is not diaphoretic.  HENT:     Head: Normocephalic and atraumatic.     Right Ear: External ear normal.     Left Ear: External ear normal.  Eyes:     Conjunctiva/sclera: Conjunctivae normal.  Cardiovascular:     Rate and Rhythm: Normal rate and regular rhythm.     Heart sounds: Normal heart sounds.  Pulmonary:     Effort: Pulmonary effort is normal.  Abdominal:     General: Bowel sounds are normal. There is no distension.     Palpations: Abdomen is soft.     Tenderness: There is no abdominal tenderness. There is no guarding.  Musculoskeletal:     Cervical back: Neck supple.  Skin:    General: Skin is warm and dry.     Capillary Refill: Capillary refill takes less than 2 seconds.  Neurological:     Mental Status: She is alert. Mental  status is at baseline.  Psychiatric:        Mood and Affect: Mood normal.        Behavior: Behavior normal.   UA: positive protein, neg LE and neg nitrites       Assessment & Plan:   Problem List Items Addressed This Visit   None Visit Diagnoses     Dysuria    -  Primary   Relevant Orders   POCT Urinalysis Dipstick (Automated) (Completed)   Urine Culture      Discussed it could be a resolving UTI but will send for culture to verify. Treat if positive  Cranberry and azo and avoid bladder irritants.    Return if symptoms worsen or fail to improve.  Lynnda Child, MD  This visit occurred during the SARS-CoV-2 public health emergency.  Safety protocols were in place, including screening questions prior to the visit, additional usage of staff PPE, and extensive cleaning of exam room while observing appropriate contact time as indicated for disinfecting solutions.

## 2021-08-06 NOTE — Patient Instructions (Signed)
Avoid bladder irritants -- alcohol, caffeine, spicy foods, carbonation  Consider - hydration -- water lots of it - Cranberry juice or supplement - can also try azo if needed  Will follow-up culture

## 2021-08-07 LAB — URINE CULTURE
MICRO NUMBER:: 12517448
SPECIMEN QUALITY:: ADEQUATE

## 2021-09-11 ENCOUNTER — Other Ambulatory Visit: Payer: Self-pay

## 2021-09-11 ENCOUNTER — Encounter: Payer: Self-pay | Admitting: Emergency Medicine

## 2021-09-11 ENCOUNTER — Ambulatory Visit
Admission: EM | Admit: 2021-09-11 | Discharge: 2021-09-11 | Disposition: A | Discharge status: Home / Self Care | Payer: 59 | Attending: Internal Medicine | Admitting: Internal Medicine

## 2021-09-11 DIAGNOSIS — J09X2 Influenza due to identified novel influenza A virus with other respiratory manifestations: Secondary | ICD-10-CM

## 2021-09-11 LAB — POCT INFLUENZA A/B
Influenza A, POC: POSITIVE — AB
Influenza B, POC: NEGATIVE

## 2021-09-11 MED ORDER — OSELTAMIVIR PHOSPHATE 75 MG PO CAPS: 75.0000 mg | ORAL_CAPSULE | Freq: Two times a day (BID) | ORAL | 0 refills | Status: DC

## 2021-09-11 MED ORDER — BENZONATATE 200 MG PO CAPS: 200.0000 mg | ORAL_CAPSULE | Freq: Three times a day (TID) | ORAL | 0 refills | Status: DC | PRN

## 2021-09-11 NOTE — Discharge Instructions (Signed)
You may not be around people until you have not had a fever for 24 hours.

## 2021-09-11 NOTE — ED Triage Notes (Signed)
Pt here with flu-like sx since yesterday.  

## 2021-09-19 ENCOUNTER — Other Ambulatory Visit: Payer: Self-pay

## 2021-09-19 ENCOUNTER — Encounter: Payer: Self-pay | Admitting: Emergency Medicine

## 2021-09-19 ENCOUNTER — Ambulatory Visit
Admission: EM | Admit: 2021-09-19 | Discharge: 2021-09-19 | Disposition: A | Discharge status: Home / Self Care | Payer: 59 | Attending: Family Medicine | Admitting: Family Medicine

## 2021-09-19 DIAGNOSIS — J0110 Acute frontal sinusitis, unspecified: Secondary | ICD-10-CM | POA: Diagnosis not present

## 2021-09-19 MED ORDER — AZITHROMYCIN 250 MG PO TABS: ORAL_TABLET | ORAL | 0 refills | Status: DC

## 2021-09-19 NOTE — ED Provider Notes (Signed)
Renaldo Fiddler    CSN: [id number] Arrival date & time: 09/11/21  1702      History   Chief Complaint Chief Complaint  Patient presents with   Cough   Nasal Congestion   Fever    HPI Jennifer Mcintosh is a 60 y.o. female.   HPI Patient presents today with acute onset fever, cough, nasal congestion. She also has associated body aches and just feels fatigue.  All symptoms started x1 day ago.  On arrival today she has a fever of 101.1.  She has been taking over-the-counter medication for body aches and fever management.  She has no underlying chronic respiratory disease. Past Medical History:  Diagnosis Date   Allergy    avoids antihistamines 2/2 intolerance    Anxiety    Breast cyst    drained by Dr. Micah Noel in the past    Depression    took meds in 1998 x few months per Dr. Thedore Mins notes   Psoriasis    UTI (urinary tract infection)     Patient Active Problem List   Diagnosis Date Noted   PVC's (premature ventricular contractions) 02/09/2020   Chronic pain of right ankle 02/09/2020   Annual physical exam 09/02/2019   Hemorrhoids 09/02/2019   Acute pain of left shoulder 09/02/2019   Cervicalgia 09/02/2019   Psoriasis 06/02/2019   Anxiety 06/02/2019   HSV infection 06/02/2019   Postmenopausal 11/27/2016   Acute frontal sinusitis 04/19/2014    Past Surgical History:  Procedure Laterality Date   BREAST CYST ASPIRATION Right 2010   NEG   CESAREAN SECTION     x 1     OB History   No obstetric history on file.      Home Medications    Prior to Admission medications   Medication Sig Start Date End Date Taking? Authorizing Provider  benzonatate (TESSALON) 200 MG capsule Take 1 capsule (200 mg total) by mouth 3 (three) times daily as needed for cough. 09/11/21  Yes Rodriguez-Southworth, Nettie Elm, PA-C  oseltamivir (TAMIFLU) 75 MG capsule Take 1 capsule (75 mg total) by mouth every 12 (twelve) hours. 09/11/21  Yes Rodriguez-Southworth, Nettie Elm, PA-C   ALPRAZolam (XANAX) 0.25 MG tablet Take 1 tablet (0.25 mg total) by mouth daily as needed for anxiety. 04/20/20   McLean-Scocuzza, Pasty Spillers, MD  azithromycin (ZITHROMAX) 250 MG tablet Take 2 tabs PO x 1 dose, then 1 tab PO QD x 4 days 09/19/21   Bing Neighbors, FNP  CALCIUM PO Take by mouth.    [provider]  citalopram (CELEXA) 10 MG tablet Take 1 tablet (10 mg total) by mouth daily. In am 09/21/20   McLean-Scocuzza, Pasty Spillers, MD  Multiple Vitamin (MULTIVITAMIN) tablet Take 1 tablet by mouth daily.    [provider]  SKYRIZI, 150 MG DOSE, 75 MG/0.83ML PSKT  03/28/20   [provider]  valACYclovir (VALTREX) 500 MG tablet TAKE 1 TABLET(500 MG) BY MOUTH TWICE DAILY FOR 3 TO 7 DAYS AS NEEDED FOR OUTBREAK 07/10/21   Dale [place], MD  VITAMIN E PO Take by mouth.    [provider]    Family History Family History  Problem Relation Age of Onset   Dementia Mother        onset 39 died 42   Alzheimer's disease Mother    Hyperlipidemia Mother    Gout Father    Heart disease Father        MI age 84    Other Father  gout   Deep vein thrombosis Father    Arthritis Father    Asthma Sister    Obesity Brother    Arthritis Brother        hip replacement    Heart disease Son        ablation heart d/o    Asthma Son    Breast cancer Neg Hx     Social History Social History   Tobacco Use   Smoking status: Never   Smokeless tobacco: Never  Vaping Use   Vaping Use: Never used  Substance Use Topics   Alcohol use: Yes    Comment: 2 wine glasses qhs   Drug use: Not Currently     Allergies   Patient has no known allergies.   Review of Systems Review of Systems Pertinent negatives listed in HPI   Physical Exam Triage Vital Signs ED Triage Vitals  Enc Vitals Group     BP 09/11/21 1738 117/71     Pulse Rate 09/11/21 1738 (!) 106     Resp 09/11/21 1738 20     Temp 09/11/21 1738 (!) 101.1 F (38.4 C)     Temp src --      SpO2 09/11/21  1738 98 %     Weight --      Height --      Head Circumference --      Peak Flow --      Pain Score 09/11/21 1739 5     Pain Loc --      Pain Edu? --      Excl. in GC? --    No data found.  Updated Vital Signs BP 117/71   Pulse (!) 106   Temp (!) 101.1 F (38.4 C)   Resp 20   SpO2 98%   Visual Acuity Right Eye Distance:   Left Eye Distance:   Bilateral Distance:    Right Eye Near:   Left Eye Near:    Bilateral Near:     Physical Exam  General Appearance:    Alert, acutely ill appearing, cooperative, no distress  HENT:   Normocephalic, ears normal, nares mucosal edema with congestion, rhinorrhea, oropharynx  w/o erythema or exudate  Eyes:    PERRL, conjunctiva/corneas clear, EOM's intact       Lungs:     Clear to auscultation bilaterally, respirations unlabored  Heart:    Regular rate and rhythm  Neurologic:   Awake, alert, oriented x 3. No apparent focal neurological           defect.      UC Treatments / Results  Labs (all labs ordered are listed, but only abnormal results are displayed) Labs Reviewed  POCT INFLUENZA A/B - Abnormal; Notable for the following components:      Result Value   Influenza A, POC Positive (*)    All other components within normal limits    EKG   Radiology No results found.  Procedures Procedures (including critical care time)  Medications Ordered in UC Medications - No data to display  Initial Impression / Assessment and Plan / UC Course  I have reviewed the triage vital signs and the nursing notes.  Pertinent labs & imaging results that were available during my care of the patient were reviewed by me and considered in my medical decision making (see chart for details).    Influenza test is positive Continue to alternate Tylenol and ibuprofen for management of fever. Force fluids to maintain hydration. Tamiflu  twice daily for the next 5 days to reduce symptoms and course of influenza virus.  If you develop any  shortness of breath, wheezing or difficulty breathing go immediately to the nearest emergency department.  Final Clinical Impressions(s) / UC Diagnoses   Final diagnoses:  Influenza due to identified novel influenza A virus with other respiratory manifestations     Discharge Instructions      You may not be around people until you have not had a fever for 24 hours.     ED Prescriptions     Medication Sig Dispense Auth. Provider   oseltamivir (TAMIFLU) 75 MG capsule Take 1 capsule (75 mg total) by mouth every 12 (twelve) hours. 10 capsule Rodriguez-Southworth, Sunday Spillers, PA-C   benzonatate (TESSALON) 200 MG capsule Take 1 capsule (200 mg total) by mouth 3 (three) times daily as needed for cough. 30 capsule Rodriguez-Southworth, Sunday Spillers, PA-C      PDMP not reviewed this encounter.   Scot Jun, FNP 09/19/21 1145

## 2021-09-19 NOTE — ED Triage Notes (Signed)
Pt c/o sinus pressure/pain and nasal congestion x 2 days. Pt dx with flu last week.

## 2021-09-19 NOTE — ED Provider Notes (Signed)
Jennifer Mcintosh    CSN: [id number] Arrival date & time: 09/19/21  1103      History   Chief Complaint Chief Complaint  Patient presents with   Sinus Pressure     HPI Jennifer Mcintosh is a 60 y.o. female.   HPI Patient presents today with 2 days of sinus congestion following a diagnosis of influenza over a week ago.  Patient has a history of allergies and recurrent sinus infections. She continues to experience a cough and endorses postnasal drainage.  She is afebrile and denies any difficulty breathing or any wheezing  Past Medical History:  Diagnosis Date   Allergy    avoids antihistamines 2/2 intolerance    Anxiety    Breast cyst    drained by Dr. Micah Noel in the past    Depression    took meds in 1998 x few months per Dr. Thedore Mins notes   Psoriasis    UTI (urinary tract infection)     Patient Active Problem List   Diagnosis Date Noted   PVC's (premature ventricular contractions) 02/09/2020   Chronic pain of right ankle 02/09/2020   Annual physical exam 09/02/2019   Hemorrhoids 09/02/2019   Acute pain of left shoulder 09/02/2019   Cervicalgia 09/02/2019   Psoriasis 06/02/2019   Anxiety 06/02/2019   HSV infection 06/02/2019   Postmenopausal 11/27/2016   Acute frontal sinusitis 04/19/2014    Past Surgical History:  Procedure Laterality Date   BREAST CYST ASPIRATION Right 2010   NEG   CESAREAN SECTION     x 1     OB History   No obstetric history on file.      Home Medications    Prior to Admission medications   Medication Sig Start Date End Date Taking? Authorizing Provider  azithromycin (ZITHROMAX) 250 MG tablet Take 2 tabs PO x 1 dose, then 1 tab PO QD x 4 days 09/19/21  Yes Bing Neighbors, FNP  ALPRAZolam Prudy Feeler) 0.25 MG tablet Take 1 tablet (0.25 mg total) by mouth daily as needed for anxiety. 04/20/20   McLean-Scocuzza, Pasty Spillers, MD  benzonatate (TESSALON) 200 MG capsule Take 1 capsule (200 mg total) by mouth 3 (three) times daily as needed for  cough. 09/11/21   Rodriguez-Southworth, Nettie Elm, PA-C  CALCIUM PO Take by mouth.    [provider]  citalopram (CELEXA) 10 MG tablet Take 1 tablet (10 mg total) by mouth daily. In am 09/21/20   McLean-Scocuzza, Pasty Spillers, MD  Multiple Vitamin (MULTIVITAMIN) tablet Take 1 tablet by mouth daily.    [provider]  oseltamivir (TAMIFLU) 75 MG capsule Take 1 capsule (75 mg total) by mouth every 12 (twelve) hours. 09/11/21   Rodriguez-Southworth, Nettie Elm, PA-C  SKYRIZI, 150 MG DOSE, 75 MG/0.83ML PSKT  03/28/20   [provider]  valACYclovir (VALTREX) 500 MG tablet TAKE 1 TABLET(500 MG) BY MOUTH TWICE DAILY FOR 3 TO 7 DAYS AS NEEDED FOR OUTBREAK 07/10/21   Dale [place]stone, MD  VITAMIN E PO Take by mouth.    [provider]    Family History Family History  Problem Relation Age of Onset   Dementia Mother        onset 34 died 68   Alzheimer's disease Mother    Hyperlipidemia Mother    Gout Father    Heart disease Father        MI age 63    Other Father        gout   Deep vein  thrombosis Father    Arthritis Father    Asthma Sister    Obesity Brother    Arthritis Brother        hip replacement    Heart disease Son        ablation heart d/o    Asthma Son    Breast cancer Neg Hx     Social History Social History   Tobacco Use   Smoking status: Never   Smokeless tobacco: Never  Vaping Use   Vaping Use: Never used  Substance Use Topics   Alcohol use: Yes    Comment: 2 wine glasses qhs   Drug use: Not Currently     Allergies   Patient has no known allergies.   Review of Systems Review of Systems Pertinent negatives listed in HPI  Physical Exam Triage Vital Signs ED Triage Vitals  Enc Vitals Group     BP 09/19/21 1120 109/68     Pulse Rate 09/19/21 1120 69     Resp 09/19/21 1120 18     Temp 09/19/21 1120 99 F (37.2 C)     Temp Source 09/19/21 1120 Oral     SpO2 09/19/21 1120 95 %     Weight --      Height --      Head Circumference  --      Peak Flow --      Pain Score 09/19/21 1119 0     Pain Loc --      Pain Edu? --      Excl. in GC? --    No data found.  Updated Vital Signs BP 109/68 (BP Location: Left Arm)   Pulse 69   Temp 99 F (37.2 C) (Oral)   Resp 18   SpO2 95%   Visual Acuity Right Eye Distance:   Left Eye Distance:   Bilateral Distance:    Right Eye Near:   Left Eye Near:    Bilateral Near:     Physical Exam  General Appearance:    Alert, cooperative, no distress  HENT:   Normocephalic, ears normal, nares mucosal edema with congestion, rhinorrhea, oropharynx  pale w/o erythema   Eyes:    PERRL, conjunctiva/corneas clear, EOM's intact       Lungs:     Clear to auscultation bilaterally, respirations unlabored  Heart:    Regular rate and rhythm  Neurologic:   Awake, alert, oriented x 3. No apparent focal neurological           defect.      UC Treatments / Results  Labs (all labs ordered are listed, but only abnormal results are displayed) Labs Reviewed - No data to display  EKG   Radiology No results found.  Procedures Procedures (including critical care time)  Medications Ordered in UC Medications - No data to display  Initial Impression / Assessment and Plan / UC Course  I have reviewed the triage vital signs and the nursing notes.  Pertinent labs & imaging results that were available during my care of the patient were reviewed by me and considered in my medical decision making (see chart for details).    Acute sinusitis Start azithromycin Continue Tessalon Perles for management of cough continue to force fluids.  Return as needed.   Final Clinical Impressions(s) / UC Diagnoses   Final diagnoses:  Acute non-recurrent frontal sinusitis   Discharge Instructions   None    ED Prescriptions     Medication Sig Dispense Auth. Provider  azithromycin (ZITHROMAX) 250 MG tablet Take 2 tabs PO x 1 dose, then 1 tab PO QD x 4 days 6 tablet Scot Jun, FNP       PDMP not reviewed this encounter.   Scot Jun, FNP 09/19/21 1153

## 2021-09-25 ENCOUNTER — Other Ambulatory Visit: Payer: Self-pay

## 2021-09-25 ENCOUNTER — Encounter: Payer: Self-pay | Admitting: Internal Medicine

## 2021-09-25 ENCOUNTER — Ambulatory Visit (INDEPENDENT_AMBULATORY_CARE_PROVIDER_SITE_OTHER): Payer: 59 | Admitting: Internal Medicine

## 2021-09-25 VITALS — BP 92/62 | HR 65 | Temp 97.0°F | Ht 68.39 in | Wt 154.0 lb

## 2021-09-25 DIAGNOSIS — Z23 Encounter for immunization: Secondary | ICD-10-CM

## 2021-09-25 DIAGNOSIS — Z1231 Encounter for screening mammogram for malignant neoplasm of breast: Secondary | ICD-10-CM | POA: Diagnosis not present

## 2021-09-25 DIAGNOSIS — Z1329 Encounter for screening for other suspected endocrine disorder: Secondary | ICD-10-CM

## 2021-09-25 DIAGNOSIS — Z1389 Encounter for screening for other disorder: Secondary | ICD-10-CM

## 2021-09-25 DIAGNOSIS — Z111 Encounter for screening for respiratory tuberculosis: Secondary | ICD-10-CM | POA: Diagnosis not present

## 2021-09-25 DIAGNOSIS — F419 Anxiety disorder, unspecified: Secondary | ICD-10-CM

## 2021-09-25 DIAGNOSIS — Z0000 Encounter for general adult medical examination without abnormal findings: Secondary | ICD-10-CM | POA: Diagnosis not present

## 2021-09-25 DIAGNOSIS — Z13220 Encounter for screening for lipoid disorders: Secondary | ICD-10-CM

## 2021-09-25 DIAGNOSIS — F32A Depression, unspecified: Secondary | ICD-10-CM

## 2021-09-25 LAB — COMPREHENSIVE METABOLIC PANEL
ALT: 12 U/L (ref 0–35)
AST: 17 U/L (ref 0–37)
Albumin: 4.1 g/dL (ref 3.5–5.2)
Alkaline Phosphatase: 54 U/L (ref 39–117)
BUN: 16 mg/dL (ref 6–23)
CO2: 30 mEq/L (ref 19–32)
Calcium: 9.4 mg/dL (ref 8.4–10.5)
Chloride: 103 mEq/L (ref 96–112)
Creatinine, Ser: 0.86 mg/dL (ref 0.40–1.20)
GFR: 73.05 mL/min (ref >60.00)
Glucose, Bld: 82 mg/dL (ref 70–99)
Potassium: 4.2 mEq/L (ref 3.5–5.1)
Sodium: 139 mEq/L (ref 135–145)
Total Bilirubin: 0.6 mg/dL (ref 0.2–1.2)
Total Protein: 6.8 g/dL (ref 6.0–8.3)

## 2021-09-25 LAB — LIPID PANEL
Cholesterol: 176 mg/dL (ref 0–200)
HDL: 70.4 mg/dL (ref >39.00)
LDL Cholesterol: 92 mg/dL (ref 0–99)
NonHDL: 105.45
Total CHOL/HDL Ratio: 2
Triglycerides: 65 mg/dL (ref 0.0–149.0)
VLDL: 13 mg/dL (ref 0.0–40.0)

## 2021-09-25 LAB — TSH: TSH: 1.32 u[IU]/mL (ref 0.35–5.50)

## 2021-09-25 LAB — CBC WITH DIFFERENTIAL/PLATELET
Basophils Absolute: 0.1 10*3/uL (ref 0.0–0.1)
Basophils Relative: 2.1 % (ref 0.0–3.0)
Eosinophils Absolute: 0.2 10*3/uL (ref 0.0–0.7)
Eosinophils Relative: 4 % (ref 0.0–5.0)
HCT: 42 % (ref 36.0–46.0)
Hemoglobin: 13.8 g/dL (ref 12.0–15.0)
Lymphocytes Relative: 30.9 % (ref 12.0–46.0)
Lymphs Abs: 1.3 10*3/uL (ref 0.7–4.0)
MCHC: 32.8 g/dL (ref 30.0–36.0)
MCV: 93.7 fl (ref 78.0–100.0)
Monocytes Absolute: 0.4 10*3/uL (ref 0.1–1.0)
Monocytes Relative: 9.2 % (ref 3.0–12.0)
Neutro Abs: 2.3 10*3/uL (ref 1.4–7.7)
Neutrophils Relative %: 53.8 % (ref 43.0–77.0)
Platelets: 340 10*3/uL (ref 150.0–400.0)
RBC: 4.48 Mil/uL (ref 3.87–5.11)
RDW: 13.7 % (ref 11.5–15.5)
WBC: 4.3 10*3/uL (ref 4.0–10.5)

## 2021-09-25 MED ORDER — VALACYCLOVIR HCL 500 MG PO TABS: ORAL_TABLET | ORAL | 3 refills | Status: DC

## 2021-09-25 MED ORDER — SHINGRIX 50 MCG/0.5ML IM SUSR: 0.5000 mL | Freq: Once | INTRAMUSCULAR | 1 refills | Status: AC

## 2021-09-25 MED ORDER — CITALOPRAM HYDROBROMIDE 10 MG PO TABS: 10.0000 mg | ORAL_TABLET | Freq: Every day | ORAL | 3 refills | Status: DC

## 2021-09-25 MED ORDER — ALPRAZOLAM 0.25 MG PO TABS: 0.2500 mg | ORAL_TABLET | Freq: Every day | ORAL | 5 refills | Status: DC | PRN

## 2021-09-25 NOTE — Patient Instructions (Signed)
Pneumococcal Conjugate Vaccine (Prevnar 13) Suspension for Injection What is this medication? PNEUMOCOCCAL VACCINE (NEU mo KOK al vak SEEN) is a vaccine used to prevent pneumococcus bacterial infections. These bacteria can cause serious infections like pneumonia, meningitis, and blood infections. This vaccine will lower your chance of getting pneumonia. If you do get pneumonia, it can make your symptoms milder and your illness shorter. This vaccine will not treat an infection and will not cause infection. This vaccine is recommended for infants and young children, adults with certain medical conditions, and adults 65 years or older. This medicine may be used for other purposes; ask your health care provider or pharmacist if you have questions. COMMON BRAND NAME(S): Prevnar, Prevnar 13 What should I tell my care team before I take this medication? They need to know if you have any of these conditions: bleeding problems fever immune system problems an unusual or allergic reaction to pneumococcal vaccine, diphtheria toxoid, other vaccines, latex, other medicines, foods, dyes, or preservatives pregnant or trying to get pregnant breast-feeding How should I use this medication? This vaccine is for injection into a muscle. It is given by a health care professional. A copy of Vaccine Information Statements will be given before each vaccination. Read this sheet carefully each time. The sheet may change frequently. Talk to your pediatrician regarding the use of this medicine in children. While this drug may be prescribed for children as young as 6 weeks old for selected conditions, precautions do apply. Overdosage: If you think you have taken too much of this medicine contact a poison control center or emergency room at once. NOTE: This medicine is only for you. Do not share this medicine with others. What if I miss a dose? It is important not to miss your dose. Call your doctor or health care professional  if you are unable to keep an appointment. What may interact with this medication? medicines for cancer chemotherapy medicines that suppress your immune function steroid medicines like prednisone or cortisone This list may not describe all possible interactions. Give your health care provider a list of all the medicines, herbs, non-prescription drugs, or dietary supplements you use. Also tell them if you smoke, drink alcohol, or use illegal drugs. Some items may interact with your medicine. What should I watch for while using this medication? Mild fever and pain should go away in 3 days or less. Report any unusual symptoms to your doctor or health care professional. What side effects may I notice from receiving this medication? Side effects that you should report to your doctor or health care professional as soon as possible: allergic reactions like skin rash, itching or hives, swelling of the face, lips, or tongue breathing problems confused fast or irregular heartbeat fever over 102 degrees F seizures unusual bleeding or bruising unusual muscle weakness Side effects that usually do not require medical attention (report to your doctor or health care professional if they continue or are bothersome): aches and pains diarrhea fever of 102 degrees F or less headache irritable loss of appetite pain, tender at site where injected trouble sleeping This list may not describe all possible side effects. Call your doctor for medical advice about side effects. You may report side effects to FDA at 1-800-FDA-1088. Where should I keep my medication? This does not apply. This vaccine is given in a clinic, pharmacy, doctor's office, or other health care setting and will not be stored at home. NOTE: This sheet is a summary. It may not cover all possible   information. If you have questions about this medicine, talk to your doctor, pharmacist, or health care provider.  2022 [employer]vier/Gold Standard  (2014-07-13 00:00:00)  

## 2021-09-25 NOTE — Progress Notes (Signed)
Chief Complaint  Patient presents with   Annual Exam   Annual doing well  Anxiety controlled on celexa 10 mg qd and prn xanax 0.25 mg qd prn  Psa on skyrizi will check tb blood send to Dr. Archie Balboa    Review of Systems  Constitutional:  Negative for weight loss.  HENT:  Negative for hearing loss.   Eyes:  Negative for blurred vision.  Respiratory:  Negative for shortness of breath.   Cardiovascular:  Negative for chest pain.  Gastrointestinal:  Negative for abdominal pain and blood in stool.  Genitourinary:  Negative for dysuria.  Musculoskeletal:  Negative for falls and joint pain.  Skin:  Negative for rash.  Neurological:  Negative for headaches.  Psychiatric/Behavioral:  Negative for depression.   Past Medical History:  Diagnosis Date   Allergy    avoids antihistamines 2/2 intolerance    Anxiety    Breast cyst    drained by Dr. Micah Noel in the past    COVID-19    05/2020   Depression    took meds in 1998 x few months per Dr. Thedore Mins notes   Psoriasis    UTI (urinary tract infection)    Past Surgical History:  Procedure Laterality Date   BREAST CYST ASPIRATION Right 2010   NEG   CESAREAN SECTION     x 1    Family History  Problem Relation Age of Onset   Dementia Mother        onset 64 died 5   Alzheimer's disease Mother    Hyperlipidemia Mother    Gout Father    Heart disease Father        MI age 46    Other Father        gout   Deep vein thrombosis Father    Arthritis Father    Asthma Sister    Obesity Brother    Arthritis Brother        hip replacement    Heart disease Son        ablation heart d/o    Asthma Son    Breast cancer Neg Hx    Social History   Socioeconomic History   Marital status: Married    Spouse name: Not on file   Number of children: Not on file   Years of education: Not on file   Highest education level: Not on file  Occupational History   Not on file  Tobacco Use   Smoking status: Never   Smokeless tobacco: Never  Vaping Use    Vaping Use: Never used  Substance and Sexual Activity   Alcohol use: Yes    Comment: 2 wine glasses qhs   Drug use: Not Currently   Sexual activity: Not Currently  Other Topics Concern   Not on file  Social History Narrative   Marred with 2 sons    Never smoker    2 glasses wine qhs    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   Multiple Vitamin (MULTIVITAMIN) tablet Take 1 tablet by mouth daily.   SKYRIZI, 150 MG DOSE, 75 MG/0.83ML PSKT    VITAMIN E PO Take by mouth.   Zoster Vaccine Adjuvanted [facility]) injection Inject 0.5 mLs into the muscle once for 1 dose. X 2 doses   [DISCONTINUED] ALPRAZolam (XANAX) 0.25 MG tablet Take 1 tablet (0.25 mg total) by mouth daily as needed for anxiety.   [DISCONTINUED] benzonatate (TESSALON) 200 MG capsule Take 1 capsule (200 mg total) by mouth 3 (three) times daily as needed for cough.   [DISCONTINUED] citalopram (CELEXA) 10 MG tablet Take 1 tablet (10 mg total) by mouth daily. In am   [DISCONTINUED] valACYclovir (VALTREX) 500 MG tablet TAKE 1 TABLET(500 MG) BY MOUTH TWICE DAILY FOR 3 TO 7 DAYS AS NEEDED FOR OUTBREAK   No Known Allergies Recent Results (from the past 2160 hour(s))  POCT Urinalysis Dipstick (Automated)     Status: Abnormal   Collection Time: 08/06/21 12:25 PM  Result Value Ref Range   Color, UA yellow    Clarity, UA clear    Glucose, UA Negative Negative   Bilirubin, UA negative    Ketones, UA negative    Spec Grav, UA 1.010 1.010 - 1.025    Blood, UA negative    pH, UA 6.0 5.0 - 8.0   Protein, UA Positive (A) Negative    Comment: 15 mg   Urobilinogen, UA 0.2 0.2 or 1.0 E.U./dL   Nitrite, UA negative    Leukocytes, UA Negative Negative  Urine Culture     Status: None   Collection Time: 08/06/21 12:42 PM   Specimen: Urine  Result Value Ref Range   MICRO NUMBER: [id number]    SPECIMEN QUALITY: Adequate    Sample Source NOT GIVEN    STATUS: FINAL    ISOLATE 1:      Mixed genital flora isolated. These superficial bacteria are not indicative of a urinary tract infection. No further organism identification is warranted on this specimen. If clinically indicated, recollect clean-catch, mid-stream urine and transfer  immediately to Urine Culture Transport Tube.   POCT Influenza A/B     Status: Abnormal   Collection Time: 09/11/21  5:41 PM  Result Value Ref Range   Influenza A, POC Positive (A) Negative   Influenza B, POC Negative Negative   Objective  Body mass index is 23.15 kg/m. Wt Readings from Last 3 Encounters:  09/25/21 154 lb (69.9 kg)  08/06/21 160 lb 8 oz (72.8 kg)  01/17/21 152 lb (68.9 kg)   Temp Readings from Last 3 Encounters:  09/25/21 (!) 97 F (36.1 C) (Temporal)  09/19/21 99 F (37.2 C) (Oral)  09/11/21 (!) 101.1 F (38.4 C)   BP Readings from Last 3 Encounters:  09/25/21 92/62  09/19/21 109/68  09/11/21 117/71   Pulse Readings from Last 3 Encounters:  09/25/21 65  09/19/21 69  09/11/21 (!) 106    Physical Exam Vitals and nursing note reviewed.  Constitutional:      Appearance: Normal appearance. She is well-developed and well-groomed.  HENT:     Head: Normocephalic and atraumatic.  Eyes:     Conjunctiva/sclera: Conjunctivae normal.     Pupils: Pupils are equal, round, and reactive to light.  Cardiovascular:     Rate and Rhythm: Normal rate and regular rhythm.     Heart sounds: Normal heart sounds. No murmur  heard. Pulmonary:     Effort: Pulmonary effort is normal.     Breath  sounds: Normal breath sounds.  Chest:     Chest wall: No mass.  Breasts:    Breasts are symmetrical.     Right: Normal.     Left: Normal.  Abdominal:     General: Abdomen is flat. Bowel sounds are normal.     Tenderness: There is no abdominal tenderness.  Musculoskeletal:        General: No tenderness.  Lymphadenopathy:     Upper Body:     Right upper body: No axillary adenopathy.     Left upper body: No axillary adenopathy.  Skin:    General: Skin is warm and dry.  Neurological:     General: No focal deficit present.     Mental Status: She is alert and oriented to person, place, and time. Mental status is at baseline.     Cranial Nerves: Cranial nerves 2-12 are intact.     Gait: Gait is intact.  Psychiatric:        Attention and Perception: Attention and perception normal.        Mood and Affect: Mood and affect normal.        Speech: Speech normal.        Behavior: Behavior normal. Behavior is cooperative.        Thought Content: Thought content normal.        Cognition and Memory: Cognition and memory normal.        Judgment: Judgment normal.    Assessment  Plan  Annual physical exam - Plan: fasting labs today See below      Anxiety and depression controlled- Plan: citalopram (CELEXA) 10 MG tablet ALPRAZolam (XANAX) 0.25 MG tablet    Fasting labs today  flu shot utd  Tdap 11/27/16 covid vx total 5/5 pfizer, moderna last 08/05/21  Prevnar 13 given today Rx shingrix given today   covid 05/2020 + son gave to her    Mammogram 07/21/2019 and breast exam 10/03/20    DEXA 12/03/16 normal    Colonoscopy 03/04/13 2 hyperplastic polyps f/u in 10 years    Pap due last in 01/12/2015 negative negative HPV  -prev used premarin vaginal atrophy  -h/o abnormal pap remotely  -pap 09/02/2019 negative    Skin appt 06/2020 Dr. Sharlee Blew Smoker 6 months in college  rec healthy diet and exercise    Provider: Dr. French Ana McLean-Scocuzza-Internal Medicine

## 2021-09-26 LAB — URINALYSIS, ROUTINE W REFLEX MICROSCOPIC
Bacteria, UA: NONE SEEN /HPF
Bilirubin Urine: NEGATIVE
Glucose, UA: NEGATIVE
Hgb urine dipstick: NEGATIVE
Hyaline Cast: NONE SEEN /LPF
Ketones, ur: NEGATIVE
Nitrite: NEGATIVE
Protein, ur: NEGATIVE
RBC / HPF: NONE SEEN /HPF (ref 0–2)
Specific Gravity, Urine: 1.008 (ref 1.001–1.035)
Squamous Epithelial / HPF: NONE SEEN /HPF (ref <=5)
WBC, UA: NONE SEEN /HPF (ref 0–5)
pH: 7 (ref 5.0–8.0)

## 2021-09-26 LAB — MICROSCOPIC MESSAGE

## 2021-09-27 LAB — QUANTIFERON-TB GOLD PLUS
Mitogen-NIL: >10 IU/mL
NIL: 0.03 IU/mL
QuantiFERON-TB Gold Plus: NEGATIVE
TB1-NIL: 0 IU/mL
TB2-NIL: 0 IU/mL

## 2021-12-02 ENCOUNTER — Ambulatory Visit
Admission: EM | Admit: 2021-12-02 | Discharge: 2021-12-02 | Disposition: A | Discharge status: Home / Self Care | Payer: 59 | Attending: Internal Medicine | Admitting: Internal Medicine

## 2021-12-02 ENCOUNTER — Other Ambulatory Visit: Payer: Self-pay

## 2021-12-02 ENCOUNTER — Encounter: Payer: Self-pay | Admitting: *Deleted

## 2021-12-02 DIAGNOSIS — J0101 Acute recurrent maxillary sinusitis: Secondary | ICD-10-CM

## 2021-12-02 MED ORDER — AMOXICILLIN-POT CLAVULANATE 875-125 MG PO TABS
1.0000 | ORAL_TABLET | Freq: Two times a day (BID) | ORAL | 0 refills | Status: DC
Start: 2021-12-02 — End: 2022-11-11

## 2021-12-02 MED ORDER — FLUCONAZOLE 150 MG PO TABS: 150.0000 mg | ORAL_TABLET | Freq: Every day | ORAL | 0 refills | Status: DC

## 2021-12-02 NOTE — Discharge Instructions (Signed)
Do saline nose rinses twice a day for 3-5 days or until the sinus pressure resolves Next time you start with a cold or allergies, start with the saline rinses right away to prevent it  from getting into a bacterial sinus infection.

## 2021-12-02 NOTE — ED Provider Notes (Signed)
[facility]    CSN: [id number] Arrival date & time: 12/02/21  1249      History   Chief Complaint Chief Complaint  Patient presents with   Facial Pain    HPI Bae Kukura is a 61 y.o. female who started with URI 5 days ago. Now has face pressure and forehead and been having productive cough with purulent mucous. Had a fever of 101. Had negative covid test at home. Denies hx of sinus surgeries, but is prone to them when she gets colds. Has not done any saline nose rinses.  Had HA and body aches the first day and she stayed in bed.    Past Medical History:  Diagnosis Date   Allergy    avoids antihistamines 2/2 intolerance    Anxiety    Breast cyst    drained by Dr. Mia Creek in the past    COVID-19    05/2020   Depression    took meds in 1998 x few months per Dr. 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      Home Medications    Prior to Admission medications   Medication Sig Start Date End Date Taking? Authorizing Provider  amoxicillin-clavulanate (AUGMENTIN) 875-125 MG tablet Take 1 tablet by mouth every 12 (twelve) hours. 12/02/21  Yes Rodriguez-Southworth, Sunday Spillers, PA-C  fluconazole (DIFLUCAN) 150 MG tablet Take 1 tablet (150 mg total) by mouth daily. 12/02/21  Yes Rodriguez-Southworth, Sunday Spillers, PA-C  ALPRAZolam (XANAX) 0.25 MG tablet Take 1 tablet (0.25  mg total) by mouth daily as needed for anxiety. 09/25/21   McLean-Scocuzza, Nino Glow, MD  CALCIUM PO Take by mouth.    [provider]  citalopram (CELEXA) 10 MG tablet Take 1 tablet (10 mg total) by mouth daily. In am 09/25/21   McLean-Scocuzza, Nino Glow, MD  Multiple Vitamin (MULTIVITAMIN) tablet Take 1 tablet by mouth daily.    [provider]  SKYRIZI, 150 MG DOSE, 75 MG/0.83ML PSKT  03/28/20   [provider]  valACYclovir (VALTREX) 500 MG tablet TAKE 1 TABLET(500 MG) BY MOUTH TWICE DAILY FOR 3 TO 7 DAYS AS NEEDED FOR OUTBREAK 09/25/21   McLean-Scocuzza, Nino Glow, MD  VITAMIN E PO Take by mouth.    [provider]    Family History Family History  Problem Relation Age of Onset   Dementia Mother        onset 52 died 53   Alzheimer's disease Mother    Hyperlipidemia Mother    Gout Father    Heart disease Father        MI age 41    Other Father        gout   Deep vein thrombosis Father    Arthritis Father    Asthma Sister    Obesity  Brother    Arthritis Brother        hip replacement    Heart disease Son        ablation heart d/o    Asthma Son    Breast cancer Neg Hx     Social History Social History   Tobacco Use   Smoking status: Never   Smokeless tobacco: Never  Vaping Use   Vaping Use: Never used  Substance Use Topics   Alcohol use: Yes    Comment: 2 wine glasses qhs   Drug use: Not Currently     Allergies   Patient has no known allergies.   Review of Systems Review of Systems  Constitutional:  Positive for chills, fatigue and fever. Negative for activity change, appetite change and diaphoresis.  HENT:  Positive for congestion, postnasal drip, sinus pressure, sinus pain and sore throat. Negative for ear discharge, ear pain, facial swelling and trouble swallowing.   Eyes:  Negative for discharge.  Respiratory:  Positive for cough.   Musculoskeletal:  Positive for myalgias.  Skin:  Negative for rash.  Neurological:  Positive  for headaches.  Hematological:  Negative for adenopathy.    Physical Exam Triage Vital Signs ED Triage Vitals [12/02/21 1400]  Enc Vitals Group     BP 139/75     Pulse Rate 74     Resp 16     Temp 99.3 F (37.4 C)     Temp Source Oral     SpO2 97 %     Weight      Height      Head Circumference      Peak Flow      Pain Score      Pain Loc      Pain Edu?      Excl. in [place]?    No data found.  Updated Vital Signs BP 139/75 (BP Location: Left Arm)    Pulse 74    Temp 99.3 F (37.4 C) (Oral)    Resp 16    SpO2 97%   Visual Acuity Right Eye Distance:   Left Eye Distance:   Bilateral Distance:    Right Eye Near:   Left Eye Near:    Bilateral Near:     Physical Exam Physical Exam Vitals signs and nursing note reviewed.  Constitutional:      General: She is not in acute distress.    Appearance: Normal appearance. She is not ill-appearing, toxic-appearing or diaphoretic.  HENT:     Head: Normocephalic.     Right Ear: Tympanic membrane, ear canal and external ear normal.     Left Ear: Tympanic membrane, ear canal and external ear normal.     Nose: with congestion. Has tender maxillary sinuses, with no light transillumination on the L     Mouth/Throat:     Mouth: Mucous membranes are moist.  Eyes:     General: No scleral icterus.       Right eye: No discharge.        Left eye: No discharge.     Conjunctiva/sclera: Conjunctivae normal.  Neck:     Musculoskeletal: Neck supple. No neck rigidity.  Cardiovascular:     Rate and Rhythm: Normal rate and regular rhythm.     Heart sounds: No murmur.  Pulmonary:     Effort: Pulmonary effort is normal.     Breath sounds: Normal breath sounds.  Musculoskeletal: Normal range of motion.  Lymphadenopathy:     Cervical: No cervical  adenopathy.  Skin:    General: Skin is warm and dry.     Coloration: Skin is not jaundiced.     Findings: No rash.  Neurological:     Mental Status: She is alert and oriented to person, place,  and time.     Gait: Gait normal.  Psychiatric:        Mood and Affect: Mood normal.        Behavior: Behavior normal.        Thought Content: Thought content normal.        Judgment: Judgment normal.    UC Treatments / Results  Labs (all labs ordered are listed, but only abnormal results are displayed) Labs Reviewed - No data to display  EKG   Radiology No results found.  Procedures Procedures (including critical care time)  Medications Ordered in UC Medications - No data to display  Initial Impression / Assessment and Plan / UC Course  I have reviewed the triage vital signs and the nursing notes. L maxillary sinusitis I placed her on Augmentin as noted. See instructions.      Final Clinical Impressions(s) / UC Diagnoses   Final diagnoses:  Acute recurrent maxillary sinusitis     Discharge Instructions      Do saline nose rinses twice a day for 3-5 days or until the sinus pressure resolves Next time you start with a cold or allergies, start with the saline rinses right away to prevent it  from getting into a bacterial sinus infection.      ED Prescriptions     Medication Sig Dispense Auth. Provider   amoxicillin-clavulanate (AUGMENTIN) 875-125 MG tablet Take 1 tablet by mouth every 12 (twelve) hours. 14 tablet Rodriguez-Southworth, Danisha Brassfield, PA-C   fluconazole (DIFLUCAN) 150 MG tablet Take 1 tablet (150 mg total) by mouth daily. 1 tablet Rodriguez-Southworth, Sunday Spillers, PA-C      PDMP not reviewed this encounter.   Shelby Mattocks, PA-C 12/02/21 1454

## 2021-12-02 NOTE — ED Triage Notes (Signed)
Pt reports sinus pressure for 4 days.

## 2021-12-12 ENCOUNTER — Other Ambulatory Visit: Payer: Self-pay | Admitting: Internal Medicine

## 2021-12-12 DIAGNOSIS — F32A Depression, unspecified: Secondary | ICD-10-CM

## 2022-02-06 ENCOUNTER — Telehealth: Payer: Self-pay

## 2022-02-06 ENCOUNTER — Ambulatory Visit
Admission: RE | Admit: 2022-02-06 | Discharge: 2022-02-06 | Disposition: A | Discharge status: Home / Self Care | Payer: 59 | Source: Ambulatory Visit | Attending: Internal Medicine | Admitting: Internal Medicine

## 2022-02-06 DIAGNOSIS — Z1231 Encounter for screening mammogram for malignant neoplasm of breast: Secondary | ICD-10-CM | POA: Diagnosis present

## 2022-02-06 NOTE — Telephone Encounter (Signed)
Informed pt via vm that mammogram was normal.  ?

## 2022-05-07 DIAGNOSIS — L409 Psoriasis, unspecified: Principal | ICD-10-CM

## 2022-05-07 MED ORDER — SKYRIZI 150 MG/ML SUBCUTANEOUS SYRINGE
3 refills | 0.00000 days
Start: 2022-05-07 — End: ?

## 2022-05-11 MED ORDER — SKYRIZI 150 MG/ML SUBCUTANEOUS SYRINGE
3 refills | 0 days | Status: CP
Start: 2022-05-11 — End: ?

## 2022-07-24 ENCOUNTER — Ambulatory Visit
Admit: 2022-07-24 | Discharge: 2022-07-24 | Payer: PRIVATE HEALTH INSURANCE | Attending: Student in an Organized Health Care Education/Training Program | Primary: Student in an Organized Health Care Education/Training Program

## 2022-07-24 ENCOUNTER — Other Ambulatory Visit: Admit: 2022-07-24 | Discharge: 2022-07-24 | Payer: PRIVATE HEALTH INSURANCE

## 2022-07-24 DIAGNOSIS — D229 Melanocytic nevi, unspecified: Principal | ICD-10-CM

## 2022-07-24 DIAGNOSIS — L570 Actinic keratosis: Principal | ICD-10-CM

## 2022-07-24 DIAGNOSIS — L409 Psoriasis, unspecified: Principal | ICD-10-CM

## 2022-07-24 DIAGNOSIS — L814 Other melanin hyperpigmentation: Principal | ICD-10-CM

## 2022-07-24 DIAGNOSIS — D1801 Hemangioma of skin and subcutaneous tissue: Principal | ICD-10-CM

## 2022-07-24 DIAGNOSIS — Z79899 Other long term (current) drug therapy: Principal | ICD-10-CM

## 2022-07-24 DIAGNOSIS — L578 Other skin changes due to chronic exposure to nonionizing radiation: Principal | ICD-10-CM

## 2022-07-24 DIAGNOSIS — L821 Other seborrheic keratosis: Principal | ICD-10-CM

## 2022-07-24 MED ORDER — SKYRIZI 150 MG/ML SUBCUTANEOUS SYRINGE
SUBCUTANEOUS | 3 refills | 0.00000 days | Status: CP
Start: 2022-07-24 — End: ?

## 2022-07-24 MED ORDER — TRIAMCINOLONE ACETONIDE 0.1 % TOPICAL OINTMENT
INTRAMUSCULAR | 3 refills | 0.00000 days | Status: CP
Start: 2022-07-24 — End: ?

## 2022-08-11 ENCOUNTER — Ambulatory Visit: Admit: 2022-08-11 | Discharge: 2022-08-12 | Payer: PRIVATE HEALTH INSURANCE

## 2022-08-11 DIAGNOSIS — J019 Acute sinusitis, unspecified: Principal | ICD-10-CM

## 2022-08-11 MED ORDER — AMOXICILLIN 500 MG CAPSULE
ORAL_CAPSULE | Freq: Three times a day (TID) | ORAL | 0 refills | 10 days | Status: CP
Start: 2022-08-11 — End: 2022-08-21

## 2022-08-19 ENCOUNTER — Ambulatory Visit: Payer: 59 | Admitting: Family Medicine

## 2022-08-19 ENCOUNTER — Encounter: Payer: Self-pay | Admitting: Family Medicine

## 2022-08-19 VITALS — BP 116/76 | HR 64 | Temp 98.1°F | Ht 68.39 in | Wt 156.8 lb

## 2022-08-19 DIAGNOSIS — J0110 Acute frontal sinusitis, unspecified: Secondary | ICD-10-CM | POA: Diagnosis not present

## 2022-08-19 DIAGNOSIS — L409 Psoriasis, unspecified: Secondary | ICD-10-CM | POA: Diagnosis not present

## 2022-08-19 DIAGNOSIS — F419 Anxiety disorder, unspecified: Secondary | ICD-10-CM | POA: Diagnosis not present

## 2022-08-19 DIAGNOSIS — Z13220 Encounter for screening for lipoid disorders: Secondary | ICD-10-CM

## 2022-08-19 DIAGNOSIS — Z0000 Encounter for general adult medical examination without abnormal findings: Secondary | ICD-10-CM

## 2022-08-19 DIAGNOSIS — Z1329 Encounter for screening for other suspected endocrine disorder: Secondary | ICD-10-CM

## 2022-08-19 NOTE — Patient Instructions (Signed)
It was a pleasure meeting you today. Thank you for allowing me to take part in your health care.  Our goals for today as we discussed include:   Recommend Probiotics daily while on Antibiotics and continue for at least 2 weeks after completion.  Recommend Daily recommendations of Calcium is 1200 mg and Vitamin D 800 IU to help prevent bone loss. Recommend resistance training  Schedule PAP and annual visit for Dec this year  Recommend Shingles vaccine.  This is a 2 dose series and can be given at your local pharmacy.  Please talk to your pharmacist about this.   Please follow-up with PCP in 2 months  If you have any questions or concerns, please do not hesitate to call the office at [phone].  I look forward to our next visit and until then take care and stay safe.  Regards,   Carollee Leitz, MD   [facility]

## 2022-08-19 NOTE — Progress Notes (Signed)
SUBJECTIVE:   CHIEF COMPLAINT / HPI: Transfer care  Patient presents to clinic to transfer care  No acute concerns today  Sinusitis Patient seen in urgent care on 10/23.  Treated with 10-day course of amoxicillin for 2-week history of sinus congestion drainage..  Patient reports improvement of symptoms.  Not currently on any probiotics.   Psoriasis Follows with dermatology.  Takes Skyrizi injections and tolerating well.  Mood disorder Denies any SI/HI.  Currently on citalopram 10 mg daily.  Tolerating medication well.    PERTINENT  PMH / PSH:  Psoriasis Mood disorder Postmenopausal   OBJECTIVE:   BP 116/76 (BP Location: Left Arm, Patient Position: Sitting, Cuff Size: Normal)   Pulse 64   Temp 98.1 F (36.7 C) (Oral)   Ht 5' 8.39" (1.737 m)   Wt 156 lb 12.8 oz (71.1 kg)   SpO2 99%   BMI 23.57 kg/m    General: Alert, no acute distress Cardio: Normal S1 and S2, RRR, no r/m/g Pulm: CTAB, normal work of breathing Abdomen: Bowel sounds normal. Abdomen soft and non-tender.  Extremities: No peripheral edema.  Neuro: Cranial nerves grossly intact     08/19/2022    2:12 PM 09/25/2021    8:37 AM 09/21/2020    8:29 AM 02/09/2020    9:17 AM 09/02/2019    4:06 PM  Depression screen PHQ 2/9  Decreased Interest 1 0 0 0 0  Down, Depressed, Hopeless 0 0 1 1 0  PHQ - 2 Score 1 0 1 1 0  Altered sleeping   0    Tired, decreased energy   0    Change in appetite   0    Feeling bad or failure about yourself    0    Trouble concentrating   0    Moving slowly or fidgety/restless   0    Suicidal thoughts   0    PHQ-9 Score   1    Difficult doing work/chores   Not difficult at all         09/21/2020    8:29 AM 02/09/2020    9:17 AM  GAD 7 : Generalized Anxiety Score  Nervous, Anxious, on Edge 0 1  Control/stop worrying 0 0  Worry too much - different things 0 0  Trouble relaxing 0 0  Restless 0 0  Easily annoyed or irritable 0 0  Afraid - awful might happen 0 1  Total  GAD 7 Score 0 2  Anxiety Difficulty Not difficult at all Not difficult at all      ASSESSMENT/PLAN:   Acute frontal sinusitis Acute.  Stable and improving.  Recently treated with amoxicillin for acute sinusitis at urgent care.  On day 8 of antibiotic treatment. -Continue amoxicillin 500 mg 3 times daily as previously prescribed to complete course. -Recommend probiotics daily and to continue for at least 2 weeks after completion of antibiotics -Monitor for signs of diarrheal infection. -If continues to have recurrent sinusitis could consider ENT referral  Psoriasis Chronic.  Stable.  Takes Skyrizi injections and tolerating well. -Continue follow-up with dermatology as scheduled  Anxiety Chronic.  Stable.  Patient reports taking Xanax as prescribed.  Periodically takes it to help her sleep.  Discussed risks of long-term use of benzodiazepines.  Patient prefers to not take medication. -Discontinue Xanax. -Continue Celexa 10 mg daily. -Follow-up as needed. -Strict return precautions provided.   HCM Recommend shingles vaccine Last Pap smear 08/2019, NILM, HPV negative.  Due 11/23.  Patient  schedule appointment. Annual visit scheduled for January 2024.  Annual labs entered for that time.  PDMP reviewed  Carollee Leitz, MD

## 2022-09-02 ENCOUNTER — Encounter: Payer: Self-pay | Admitting: Family Medicine

## 2022-09-02 NOTE — Assessment & Plan Note (Addendum)
Acute.  Stable and improving.  Recently treated with amoxicillin for acute sinusitis at urgent care.  On day 8 of antibiotic treatment. -Continue amoxicillin 500 mg 3 times daily as previously prescribed to complete course. -Recommend probiotics daily and to continue for at least 2 weeks after completion of antibiotics -Monitor for signs of diarrheal infection. -If continues to have recurrent sinusitis could consider ENT referral

## 2022-09-02 NOTE — Assessment & Plan Note (Addendum)
Chronic.  Stable.  Patient reports taking Xanax as prescribed.  Periodically takes it to help her sleep.  Discussed risks of long-term use of benzodiazepines.  Patient prefers to not take medication. -Discontinue Xanax. -Continue Celexa 10 mg daily. -Follow-up as needed. -Strict return precautions provided.

## 2022-09-02 NOTE — Assessment & Plan Note (Signed)
Chronic.  Stable.  Takes Skyrizi injections and tolerating well. -Continue follow-up with dermatology as scheduled

## 2022-09-06 ENCOUNTER — Ambulatory Visit: Admit: 2022-09-06 | Discharge: 2022-09-07 | Payer: PRIVATE HEALTH INSURANCE | Attending: Family | Primary: Family

## 2022-09-06 DIAGNOSIS — Z20822 Encounter for laboratory testing for COVID-19 virus: Principal | ICD-10-CM

## 2022-09-06 DIAGNOSIS — J0100 Acute maxillary sinusitis, unspecified: Principal | ICD-10-CM

## 2022-09-06 MED ORDER — AMOXICILLIN 875 MG-POTASSIUM CLAVULANATE 125 MG TABLET
ORAL_TABLET | Freq: Two times a day (BID) | ORAL | 0 refills | 10 days | Status: CP
Start: 2022-09-06 — End: 2022-09-16

## 2022-09-18 DIAGNOSIS — L409 Psoriasis, unspecified: Principal | ICD-10-CM

## 2022-09-18 MED ORDER — SKYRIZI 150 MG/ML SUBCUTANEOUS SYRINGE
SUBCUTANEOUS | 3 refills | 0.00000 days | Status: CP
Start: 2022-09-18 — End: ?

## 2022-10-05 ENCOUNTER — Ambulatory Visit: Admit: 2022-10-05 | Discharge: 2022-10-06 | Payer: PRIVATE HEALTH INSURANCE

## 2022-10-05 DIAGNOSIS — Z20822 Encounter for laboratory testing for COVID-19 virus: Principal | ICD-10-CM

## 2022-10-05 DIAGNOSIS — R0981 Nasal congestion: Principal | ICD-10-CM

## 2022-10-05 DIAGNOSIS — J069 Acute upper respiratory infection, unspecified: Principal | ICD-10-CM

## 2022-10-05 DIAGNOSIS — J0100 Acute maxillary sinusitis, unspecified: Principal | ICD-10-CM

## 2022-10-05 MED ORDER — CODEINE 10 MG-GUAIFENESIN 100 MG/5 ML ORAL LIQUID
Freq: Three times a day (TID) | ORAL | 0 refills | 8 days | Status: CP | PRN
Start: 2022-10-05 — End: ?

## 2022-10-05 MED ORDER — DOXYCYCLINE HYCLATE 100 MG CAPSULE
ORAL_CAPSULE | Freq: Two times a day (BID) | ORAL | 0 refills | 14 days | Status: CP
Start: 2022-10-05 — End: 2022-10-19

## 2022-10-22 ENCOUNTER — Telehealth: Payer: 59 | Admitting: Physician Assistant

## 2022-10-22 DIAGNOSIS — K2900 Acute gastritis without bleeding: Secondary | ICD-10-CM | POA: Diagnosis not present

## 2022-10-22 MED ORDER — SUCRALFATE 1 G PO TABS: 1.0000 g | ORAL_TABLET | Freq: Three times a day (TID) | ORAL | 0 refills | Status: DC

## 2022-10-22 NOTE — Patient Instructions (Signed)
Jennifer Mcintosh, thank you for joining Mar Daring, PA-C for today's virtual visit.  While this provider is not your primary care provider (PCP), if your PCP is located in our provider database this encounter information will be shared with them immediately following your visit.   [facility] account gives you access to today's visit and all your visits, tests, and labs performed at [facility] " click here if you don't have a [facility] account or go to [email or web address]  Consent: (Patient) Jennifer Mcintosh provided verbal consent for this virtual visit at the beginning of the encounter.  Current Medications:  Current Outpatient Medications:    sucralfate (CARAFATE) 1 g tablet, Take 1 tablet (1 g total) by mouth 4 (four) times daily -  with meals and at bedtime., Disp: 40 tablet, Rfl: 0   amoxicillin-clavulanate (AUGMENTIN) 875-125 MG tablet, Take 1 tablet by mouth every 12 (twelve) hours., Disp: 14 tablet, Rfl: 0   CALCIUM PO, Take by mouth., Disp: , Rfl:    citalopram (CELEXA) 10 MG tablet, TAKE 1 TABLET(10 MG) BY MOUTH DAILY IN THE MORNING, Disp: 90 tablet, Rfl: 1   Multiple Vitamin (MULTIVITAMIN) tablet, Take 1 tablet by mouth daily., Disp: , Rfl:    SKYRIZI, 150 MG DOSE, 75 MG/0.83ML PSKT, , Disp: , Rfl:    valACYclovir (VALTREX) 500 MG tablet, TAKE 1 TABLET(500 MG) BY MOUTH TWICE DAILY FOR 3 TO 7 DAYS AS NEEDED FOR OUTBREAK, Disp: 90 tablet, Rfl: 3   VITAMIN E PO, Take by mouth., Disp: , Rfl:    Medications ordered in this encounter:  Meds ordered this encounter  Medications   sucralfate (CARAFATE) 1 g tablet    Sig: Take 1 tablet (1 g total) by mouth 4 (four) times daily -  with meals and at bedtime.    Dispense:  40 tablet    Refill:  0    Order Specific Question:   Supervising Provider    Answer:   Chase Picket [id number]     *If you need refills on other medications prior to your next appointment, please contact your  pharmacy*  Follow-Up: Call back or seek an in-person evaluation if the symptoms worsen or if the condition fails to improve as anticipated.  [place] [phone]  Other Instructions  Gastritis, Adult Gastritis is inflammation of the stomach. There are two kinds of gastritis: Acute gastritis. This kind develops suddenly. Chronic gastritis. This kind is much more common. It develops slowly and lasts for a long time. Gastritis happens when the lining of the stomach becomes weak or gets damaged. Without treatment, gastritis can lead to stomach bleeding and ulcers. What are the causes? This condition may be caused by: An infection. Drinking too much alcohol. Certain medicines. These include steroids, antibiotics, and some over-the-counter medicines, such as aspirin or ibuprofen. Having too much acid in the stomach. Having a disease of the stomach. Other causes may include: An allergic reaction. Some cancer treatments (radiation). Smoking cigarettes or the use of products that contain nicotine or tobacco. In some cases, the cause of this condition is not known. What increases the risk? Having a disease of the intestines. Having a disease in which the body's immune system attacks the body (autoimmune disease), such as Crohn's disease. Using aspirin or ibuprofen and other NSAIDs to treat other conditions, such as heart disease or chronic pain. Stress. What are the signs or symptoms? Symptoms of this condition include: Pain or a  burning sensation in the upper abdomen. Nausea. Vomiting. An uncomfortable feeling of fullness after eating. Weight loss. Bad breath. Blood in your vomit or stool (feces). In some cases, there are no symptoms. How is this diagnosed? This condition may be diagnosed based on your medical history, a physical exam, and tests. Tests may include: Your medical history and a description of your symptoms. A physical exam. Tests. These can  include: Blood tests. Stool tests. A test in which a thin, flexible instrument with a light and a camera is passed down the esophagus and into the stomach (upper endoscopy). A test in which a tissue sample is removed to look at it under a microscope (biopsy). How is this treated? This condition may be treated with medicines. The medicines that are used vary depending on the cause of the gastritis. If the condition is caused by a bacterial infection, you may be given antibiotic medicines. If the condition is caused by too much acid in the stomach, you may be given medicines called H2 blockers, proton pump inhibitors, or antacids. Treatment may also involve stopping the use of certain medicines such as aspirin or ibuprofen and other NSAIDs. Follow these instructions at home: Medicines Take over-the-counter and prescription medicines only as told by your health care provider. If you were prescribed an antibiotic medicine, take it as told by your health care provider. Do not stop taking the antibiotic even if you start to feel better. Alcohol use Do not drink alcohol if: Your health care provider tells you not to drink. You are pregnant, may be pregnant, or are planning to become pregnant. If you drink alcohol: Limit your use to: 0-1 drink a day for women. 0-2 drinks a day for men. Know how much alcohol is in your drink. In the [place] one drink equals one 12 oz bottle of beer (355 mL), one 5 oz glass of wine (148 mL), or one 1 oz glass of hard liquor (44 mL). General instructions  Eat small, frequent meals instead of large meals. Avoid foods and drinks that make your symptoms worse. Talk with your health care provider about ways to manage stress, such as getting regular exercise or practicing deep breathing, meditation, or yoga. Do not use any products that contain nicotine or tobacco. These products include cigarettes, chewing tobacco, and vaping devices, such as e-cigarettes. If you need  help quitting, ask your health care provider. Drink enough fluid to keep your urine pale yellow. Keep all follow-up visits. This is important. Contact a health care provider if: Your symptoms get worse. Your abdominal pain gets worse. Your symptoms return after treatment. You have a fever. Get help right away if: You vomit blood or a substance that looks like coffee grounds. You have black or dark red stools. You are unable to keep fluids down. These symptoms may represent a serious problem that is an emergency. Do not wait to see if the symptoms will go away. Get medical help right away. Call your local emergency services (911 in the [place] Do not drive yourself to the hospital. Summary Gastritis is inflammation of the lining of the stomach that can occur suddenly (acute) or develop slowly over time (chronic). This condition is diagnosed with a medical history, a physical exam, or tests. This condition may be treated with medicines to treat infection or medicines to reduce the amount of acid in your stomach. Follow your health care provider's instructions about taking medicines, making changes to your diet, and knowing when to call for  help. This information is not intended to replace advice given to you by your health care provider. Make sure you discuss any questions you have with your health care provider. Document Revised: 02/09/2021 Document Reviewed: 02/09/2021 Elsevier Patient Education  [place].    If you have been instructed to have an in-person evaluation today at a local Urgent Care facility, please use the link below. It will take you to a list of all of our available [facility], including address, phone number and hours of operation. Please do not delay care.  [facility]  If you or a family member do not have a primary care provider, use the link below to schedule a visit and establish care. When you choose a [facility] primary care  physician or advanced practice provider, you gain a long-term partner in health. Find a Primary Care Provider  Learn more about [facility] in-office and virtual care options: [facility]

## 2022-10-22 NOTE — Progress Notes (Signed)
Virtual Visit Consent   Jennifer Mcintosh, you are scheduled for a virtual visit with a [facility] provider today. Just as with appointments in the office, your consent must be obtained to participate. Your consent will be active for this visit and any virtual visit you may have with one of our providers in the next 365 days. If you have a MyChart account, a copy of this consent can be sent to you electronically.  As this is a virtual visit, video technology does not allow for your provider to perform a traditional examination. This may limit your provider's ability to fully assess your condition. If your provider identifies any concerns that need to be evaluated in person or the need to arrange testing (such as labs, EKG, etc.), we will make arrangements to do so. Although advances in technology are sophisticated, we cannot ensure that it will always work on either your end or our end. If the connection with a video visit is poor, the visit may have to be switched to a telephone visit. With either a video or telephone visit, we are not always able to ensure that we have a secure connection.  By engaging in this virtual visit, you consent to the provision of healthcare and authorize for your insurance to be billed (if applicable) for the services provided during this visit. Depending on your insurance coverage, you may receive a charge related to this service.  I need to obtain your verbal consent now. Are you willing to proceed with your visit today? Jamani Eley has provided verbal consent on 10/22/2022 for a virtual visit (video or telephone). Mar Daring, PA-C  Date: 10/22/2022 5:55 PM  Virtual Visit via Video Note   I, Mar Daring, connected with  Jennifer Mcintosh  ([id number], 1961/07/17) on 10/22/22 at  5:45 PM EST by a video-enabled telemedicine application and verified that I am speaking with the correct person using two identifiers.  Location: Patient: Virtual Visit Location  Patient: Home Provider: Virtual Visit Location Provider: Home Office   I discussed the limitations of evaluation and management by telemedicine and the availability of in person appointments. The patient expressed understanding and agreed to proceed.    History of Present Illness: Jennifer Mcintosh is a 62 y.o. who identifies as a female who was assigned female at birth, and is being seen today for stomach ulcer. Symptoms started about a week ago. Have been on 3 rounds of antibiotics prior.  HPI: Abdominal Pain This is a new problem. The current episode started in the past 7 days (right at a week ago started noticing burning in her stomach with eating that has progressed to every meal and even drinks, mild nausea with symptoms now). The onset quality is sudden. The problem occurs constantly. The problem has been gradually worsening. The pain is located in the epigastric region. The pain is moderate. The quality of the pain is burning, a sensation of fullness and dull. The abdominal pain does not radiate. Associated symptoms include nausea and vomiting (with coughing from recent URI). Pertinent negatives include no belching, constipation, diarrhea, fever, headaches, hematochezia, melena or myalgias. The pain is aggravated by eating. The pain is relieved by Nothing. She has tried nothing for the symptoms. The treatment provided no relief.     Problems:  Patient Active Problem List   Diagnosis Date Noted   Chronic pain of right ankle 02/09/2020   Cervicalgia 09/02/2019   Psoriasis 06/02/2019   Anxiety 06/02/2019  Postmenopausal 11/27/2016   Acute frontal sinusitis 04/19/2014    Allergies: No Known Allergies Medications:  Current Outpatient Medications:    sucralfate (CARAFATE) 1 g tablet, Take 1 tablet (1 g total) by mouth 4 (four) times daily -  with meals and at bedtime., Disp: 40 tablet, Rfl: 0   amoxicillin-clavulanate (AUGMENTIN) 875-125 MG tablet, Take 1 tablet by mouth every 12 (twelve)  hours., Disp: 14 tablet, Rfl: 0   CALCIUM PO, Take by mouth., Disp: , Rfl:    citalopram (CELEXA) 10 MG tablet, TAKE 1 TABLET(10 MG) BY MOUTH DAILY IN THE MORNING, Disp: 90 tablet, Rfl: 1   Multiple Vitamin (MULTIVITAMIN) tablet, Take 1 tablet by mouth daily., Disp: , Rfl:    SKYRIZI, 150 MG DOSE, 75 MG/0.83ML PSKT, , Disp: , Rfl:    valACYclovir (VALTREX) 500 MG tablet, TAKE 1 TABLET(500 MG) BY MOUTH TWICE DAILY FOR 3 TO 7 DAYS AS NEEDED FOR OUTBREAK, Disp: 90 tablet, Rfl: 3   VITAMIN E PO, Take by mouth., Disp: , Rfl:   Observations/Objective: Patient is well-developed, well-nourished in no acute distress.  Resting comfortably at home.  Head is normocephalic, atraumatic.  No labored breathing.  Speech is clear and coherent with logical content.  Patient is alert and oriented at baseline.    Assessment and Plan: 1. Acute gastritis without hemorrhage, unspecified gastritis type - sucralfate (CARAFATE) 1 g tablet; Take 1 tablet (1 g total) by mouth 4 (four) times daily -  with meals and at bedtime.  Dispense: 40 tablet; Refill: 0  - Suspect gastritis, PUD, GERD from recent back to back to back antibiotics - Sucralfate prescribed - Avoid NSAIDs - Avoid acidic foods, spicy foods, caffeine, alcohol, carbonated beverages - Add Probiotic, like Align - Keep scheduled appt with PCP on 11/03/22 - Seek in person evaluation if symptoms worsen in the meantime  Follow Up Instructions: I discussed the assessment and treatment plan with the patient. The patient was provided an opportunity to ask questions and all were answered. The patient agreed with the plan and demonstrated an understanding of the instructions.  A copy of instructions were sent to the patient via MyChart unless otherwise noted below.    The patient was advised to call back or seek an in-person evaluation if the symptoms worsen or if the condition fails to improve as anticipated.  Time:  I spent 12 minutes with the patient via  telehealth technology discussing the above problems/concerns.    Mar Daring, PA-C

## 2022-11-03 ENCOUNTER — Other Ambulatory Visit (INDEPENDENT_AMBULATORY_CARE_PROVIDER_SITE_OTHER): Payer: 59

## 2022-11-03 DIAGNOSIS — Z13220 Encounter for screening for lipoid disorders: Secondary | ICD-10-CM

## 2022-11-03 DIAGNOSIS — Z0000 Encounter for general adult medical examination without abnormal findings: Secondary | ICD-10-CM

## 2022-11-03 DIAGNOSIS — Z1329 Encounter for screening for other suspected endocrine disorder: Secondary | ICD-10-CM

## 2022-11-03 LAB — CBC WITH DIFFERENTIAL/PLATELET
Basophils Absolute: 0.1 10*3/uL (ref 0.0–0.1)
Basophils Relative: 2.1 % (ref 0.0–3.0)
Eosinophils Absolute: 0.3 10*3/uL (ref 0.0–0.7)
Eosinophils Relative: 6.6 % — ABNORMAL HIGH (ref 0.0–5.0)
HCT: 43.7 % (ref 36.0–46.0)
Hemoglobin: 14.3 g/dL (ref 12.0–15.0)
Lymphocytes Relative: 36 % (ref 12.0–46.0)
Lymphs Abs: 1.6 10*3/uL (ref 0.7–4.0)
MCHC: 32.6 g/dL (ref 30.0–36.0)
MCV: 94.5 fl (ref 78.0–100.0)
Monocytes Absolute: 0.4 10*3/uL (ref 0.1–1.0)
Monocytes Relative: 10.2 % (ref 3.0–12.0)
Neutro Abs: 2 10*3/uL (ref 1.4–7.7)
Neutrophils Relative %: 45.1 % (ref 43.0–77.0)
Platelets: 285 10*3/uL (ref 150.0–400.0)
RBC: 4.63 Mil/uL (ref 3.87–5.11)
RDW: 13.3 % (ref 11.5–15.5)
WBC: 4.4 10*3/uL (ref 4.0–10.5)

## 2022-11-03 LAB — COMPREHENSIVE METABOLIC PANEL
ALT: 12 U/L (ref 0–35)
AST: 16 U/L (ref 0–37)
Albumin: 4.1 g/dL (ref 3.5–5.2)
Alkaline Phosphatase: 56 U/L (ref 39–117)
BUN: 11 mg/dL (ref 6–23)
CO2: 30 mEq/L (ref 19–32)
Calcium: 9.2 mg/dL (ref 8.4–10.5)
Chloride: 105 mEq/L (ref 96–112)
Creatinine, Ser: 0.88 mg/dL (ref 0.40–1.20)
GFR: 70.51 mL/min (ref >60.00)
Glucose, Bld: 93 mg/dL (ref 70–99)
Potassium: 4.2 mEq/L (ref 3.5–5.1)
Sodium: 141 mEq/L (ref 135–145)
Total Bilirubin: 0.5 mg/dL (ref 0.2–1.2)
Total Protein: 6.7 g/dL (ref 6.0–8.3)

## 2022-11-03 LAB — TSH: TSH: 2.36 u[IU]/mL (ref 0.35–5.50)

## 2022-11-03 LAB — VITAMIN D 25 HYDROXY (VIT D DEFICIENCY, FRACTURES): VITD: 31.05 ng/mL (ref 30.00–100.00)

## 2022-11-03 LAB — HEMOGLOBIN A1C: Hgb A1c MFr Bld: 5.8 % (ref 4.6–6.5)

## 2022-11-03 LAB — VITAMIN B12: Vitamin B-12: 475 pg/mL (ref 211–911)

## 2022-11-04 ENCOUNTER — Other Ambulatory Visit: Payer: Self-pay

## 2022-11-04 DIAGNOSIS — F419 Anxiety disorder, unspecified: Secondary | ICD-10-CM

## 2022-11-04 LAB — LIPID PANEL
Cholesterol: 206 mg/dL — ABNORMAL HIGH (ref 0–200)
HDL: 79.6 mg/dL (ref >39.00)
LDL Cholesterol: 113 mg/dL — ABNORMAL HIGH (ref 0–99)
NonHDL: 126.39
Total CHOL/HDL Ratio: 3
Triglycerides: 68 mg/dL (ref 0.0–149.0)
VLDL: 13.6 mg/dL (ref 0.0–40.0)

## 2022-11-04 MED ORDER — CITALOPRAM HYDROBROMIDE 10 MG PO TABS: ORAL_TABLET | ORAL | 1 refills | Status: DC

## 2022-11-05 ENCOUNTER — Encounter: Payer: Self-pay | Admitting: Family Medicine

## 2022-11-06 ENCOUNTER — Ambulatory Visit: Admit: 2022-11-06 | Discharge: 2022-11-07 | Payer: PRIVATE HEALTH INSURANCE

## 2022-11-06 DIAGNOSIS — L811 Chloasma: Principal | ICD-10-CM

## 2022-11-06 DIAGNOSIS — L821 Other seborrheic keratosis: Principal | ICD-10-CM

## 2022-11-06 DIAGNOSIS — L578 Other skin changes due to chronic exposure to nonionizing radiation: Principal | ICD-10-CM

## 2022-11-06 DIAGNOSIS — L409 Psoriasis, unspecified: Principal | ICD-10-CM

## 2022-11-11 ENCOUNTER — Ambulatory Visit (INDEPENDENT_AMBULATORY_CARE_PROVIDER_SITE_OTHER): Payer: 59 | Admitting: Family Medicine

## 2022-11-11 ENCOUNTER — Other Ambulatory Visit (HOSPITAL_COMMUNITY)
Admission: RE | Admit: 2022-11-11 | Discharge: 2022-11-11 | Disposition: A | Discharge status: Home / Self Care | Payer: 59 | Source: Ambulatory Visit | Attending: Family Medicine | Admitting: Family Medicine

## 2022-11-11 ENCOUNTER — Encounter: Payer: Self-pay | Admitting: Family Medicine

## 2022-11-11 VITALS — BP 110/80 | HR 78 | Temp 98.1°F | Resp 17 | Ht 68.0 in | Wt 158.0 lb

## 2022-11-11 DIAGNOSIS — Z0000 Encounter for general adult medical examination without abnormal findings: Secondary | ICD-10-CM

## 2022-11-11 DIAGNOSIS — F32A Depression, unspecified: Secondary | ICD-10-CM

## 2022-11-11 DIAGNOSIS — Z13220 Encounter for screening for lipoid disorders: Secondary | ICD-10-CM

## 2022-11-11 DIAGNOSIS — Z1211 Encounter for screening for malignant neoplasm of colon: Secondary | ICD-10-CM | POA: Diagnosis not present

## 2022-11-11 DIAGNOSIS — Z124 Encounter for screening for malignant neoplasm of cervix: Secondary | ICD-10-CM | POA: Diagnosis present

## 2022-11-11 NOTE — Progress Notes (Unsigned)
   SUBJECTIVE:   Chief Complaint  Patient presents with   Annual Exam    Physical with pap   HPI Patient presents to clinical for annual physical and Pap smear.  No acute concerns.  Denies any vaginal discharge, vaginal bleeding or dyspareunia. No abdominal pain or urinary symptoms. No previous history of abnormal Paps.   PERTINENT PMH / PSH: Postmenopausal   OBJECTIVE:  BP 110/80   Pulse 78   Temp 98.1 F (36.7 C) (Oral)   Resp 17   Ht 5\' 8"  (1.727 m)   Wt 158 lb (71.7 kg)   SpO2 98%   BMI 24.02 kg/m    Physical Exam Vitals reviewed. Exam conducted with a chaperone present.  Constitutional:      General: She is not in acute distress.    Appearance: Normal appearance. She is normal weight. She is not ill-appearing.  HENT:     Head: Normocephalic.     Nose: Nose normal.  Eyes:     Conjunctiva/sclera: Conjunctivae normal.  Cardiovascular:     Rate and Rhythm: Normal rate.  Pulmonary:     Effort: Pulmonary effort is normal.  Abdominal:     General: Abdomen is flat.     Palpations: Abdomen is soft.  Genitourinary:    General: Normal vulva.     Exam position: Lithotomy position.     Vagina: Normal. No vaginal discharge.     Cervix: Normal.     Uterus: Normal.      Adnexa: Right adnexa normal and left adnexa normal.  Musculoskeletal:     Cervical back: Normal range of motion.  Skin:    General: Skin is warm.  Neurological:     Mental Status: She is alert and oriented to person, place, and time. Mental status is at baseline.  Psychiatric:        Mood and Affect: Mood normal.        Behavior: Behavior normal.        Thought Content: Thought content normal.        Judgment: Judgment normal.     ASSESSMENT/PLAN:  Annual physical exam Assessment & Plan: HCM Elevated LDL.  Recommend increased exercise and monitoring trans fats in diet.  10-year ASCVD 2.5% risk low.  No indication for statin therapy Blood pressure well-controlled  antihypertensives. Mammogram up-to-date.  Due 2025. Colonoscopy up-to-date.  Due 02/2023.  Referral placed. DEXA up-to-date Pap smear today.  If normal and HPV negative can discontinue given patient will have aged out of screening when next due. Recommend shingles vaccine Tdap up-to-date Annual flu vaccine up-to-date Declined HIV screening. Follow-up in 1 year for repeat annual and labs.       Pap smear for cervical cancer screening -     Cytology - PAP  Colon cancer screening -     Ambulatory referral to Gastroenterology  Lipid screening -     Comprehensive metabolic panel; Future -     Lipid panel; Future -     CBC with Differential/Platelet; Future   PDMP reviewed  Return for annual visit with fasting labs 1 week prior.  Carollee Leitz, MD

## 2022-11-11 NOTE — Patient Instructions (Signed)
It was a pleasure meeting you today. Thank you for allowing me to take part in your health care.  Our goals for today as we discussed include:  Will MyChart you the results of swabs today.  If needing treatment will send in prescription to your pharmacy.   Recommend Shingles vaccine.  This is a 2 dose series and can be given at your local pharmacy.  Please talk to your pharmacist about this.    If you have any questions or concerns, please do not hesitate to call the office at [phone].  I look forward to our next visit and until then take care and stay safe.  Regards,   Carollee Leitz, MD   [facility]

## 2022-11-13 ENCOUNTER — Encounter: Payer: Self-pay | Admitting: Family Medicine

## 2022-11-13 DIAGNOSIS — Z13220 Encounter for screening for lipoid disorders: Secondary | ICD-10-CM | POA: Insufficient documentation

## 2022-11-13 DIAGNOSIS — Z0000 Encounter for general adult medical examination without abnormal findings: Secondary | ICD-10-CM | POA: Insufficient documentation

## 2022-11-13 DIAGNOSIS — Z1211 Encounter for screening for malignant neoplasm of colon: Secondary | ICD-10-CM | POA: Insufficient documentation

## 2022-11-13 DIAGNOSIS — Z124 Encounter for screening for malignant neoplasm of cervix: Secondary | ICD-10-CM | POA: Insufficient documentation

## 2022-11-13 NOTE — Assessment & Plan Note (Signed)
HCM Elevated LDL.  Recommend increased exercise and monitoring trans fats in diet.  10-year ASCVD 2.5% risk low.  No indication for statin therapy Blood pressure well-controlled antihypertensives. Mammogram up-to-date.  Due 2025. Colonoscopy up-to-date.  Due 02/2023.  Referral placed. DEXA up-to-date Pap smear today.  If normal and HPV negative can discontinue given patient will have aged out of screening when next due. Recommend shingles vaccine Tdap up-to-date Annual flu vaccine up-to-date Declined HIV screening. Follow-up in 1 year for repeat annual and labs.

## 2022-11-17 ENCOUNTER — Encounter: Payer: Self-pay | Admitting: Family Medicine

## 2022-11-17 LAB — CYTOLOGY - PAP
Comment: NEGATIVE
Diagnosis: NEGATIVE
High risk HPV: NEGATIVE

## 2023-05-19 DIAGNOSIS — L409 Psoriasis, unspecified: Principal | ICD-10-CM

## 2023-05-19 MED ORDER — SKYRIZI 150 MG/ML SUBCUTANEOUS SYRINGE
3 refills | 0 days
Start: 2023-05-19 — End: ?

## 2023-05-22 MED ORDER — SKYRIZI 150 MG/ML SUBCUTANEOUS SYRINGE
SUBCUTANEOUS | 3 refills | 0.00000 days | Status: CP
Start: 2023-05-22 — End: ?
  Filled 2023-06-10: qty 1, 84d supply, fill #0

## 2023-05-22 NOTE — Unmapped (Signed)
[facility] Specialty Medication Onboarding    Specialty Medication: SKYRIZI 150 mg/mL Syrg (risankizumab-rzaa)  Prior Authorization: Approved   Financial Assistance: Yes - copay card approved as secondary   Final Copay/Day Supply: $0 / 84 days    Insurance Restrictions: None     Notes to Pharmacist:   Credit Card on File: not applicable    The triage team has completed the benefits investigation and has determined that the patient is able to fill this medication at [facility]. Please contact the patient to complete the onboarding or follow up with the prescribing physician as needed.

## 2023-05-22 NOTE — Unmapped (Signed)
[facility]   Patient Onboarding/Medication Counseling    Denise Richards is a 62 y.o. female with Psoriasis who I am counseling today on continuation of therapy.  I am speaking to the patient.    Was a Nurse, learning disability used for this call? No    Verified patient's date of birth / HIPAA.    Specialty medication(s) to be sent: Inflammatory Disorders: Skyrizi      Non-specialty medications/supplies to be sent: No      Medications not needed at this time: N/A         Skyrizi (risankizumab)    Medication & Administration     Dosage: Psoriasis: Inject the contents of 1 syringe (150 mg total) under the skin every 12 weeks.    Lab tests required prior to treatment initiation:  Tuberculosis: Tuberculosis screening resulted in a non-reactive Quantiferon TB Gold assay. 07/24/2022    Administration:     Artist all supplies needed for injection on a clean, flat working surface: medication syringe(s) removed from packaging, alcohol swab, sharps container, etc.  Look at the medication label - look for correct medication, correct dose, and check the expiration date  Look at the medication - the liquid in the syringe should appear clear and colorless to slightly yellow, you may see tiny white or clear particles  Lay the syringe on a flat surface and allow it to warm up to room temperature for at least 30 minutes  Select injection site - you can use the front of your thigh or your belly (but not the area 2 inches around your belly button); if someone else is giving you the injection you can also use your upper arm in the skin covering your triceps muscle  Prepare injection site - wash your hands and clean the skin at the injection site with an alcohol swab and let it air dry, do not touch the injection site again before the injection  Pull off the needle safety cap, do not remove until immediately prior to injection; turn the syringe so the needle is facing up and hold the syringe at eye level with one hand so you can see the air in the syringe; using your other hand, slowly push the plunger in to push the air out through the needle  Pinch the skin - with your hand not holding the syringe pinch up a fold of skin at the injection site using your forefinger and thumb  Insert the needle into the fold of skin at about a 45 degree angle - it's best to use a quick dart-like motion  Push the plunger down slowly as far as it will go until the syringe is empty, if the plunger is not fully depressed the needle shield will not extend to cover the needle when it is removed, hold the syringe in place for a full 5 seconds  Check that the syringe is empty and keep pressing down on the plunger while you pull the needle out at the same angle as inserted; after the needle is removed completely from the skin, release the plunger allowing the needle shield to activate and cover the used needle  Dispose of the used syringe immediately in your sharps disposal container, do not attempt to recap the needle prior to disposing  If you see any blood at the injection site, press a cotton ball or gauze on the site and maintain pressure until the bleeding stops, do not rub the injection site  Pen:  Gather all supplies needed for injection on a clean, flat working surface: medication pen removed from packaging, alcohol swab, sharps container, etc.  Look at the medication label - look for correct medication, correct dose, and check the expiration date  Look at the medication through the medication inspection window - the liquid in the pen should appear clear and colorless to slightly yellow, you may see tiny white or clear particles  Lay the pen on a flat surface and allow it to warm up to room temperature for at least 30 minutes  Select injection site - you can use the front of your thigh or your belly (but not the area 2 inches around your belly button); if someone else is giving you the injection you can also use your upper arm in the skin covering your triceps muscle  Prepare injection site - wash your hands and clean the skin at the injection site with an alcohol swab and let it air dry, do not touch the injection site again before the injection  Hold the pen with your fingers on the gray grips so you can see the green activator button (it is on the side of the pen, not the top).  Pinch the skin - with your hand not holding the pen, pinch up a fold of skin at the injection site using your forefinger and thumb  Press the white needle sleeve of the pen down against the pinched skin, then press the green activator button.  You will hear a click, meaning the injection has started.  Note - the pen only activates if the white needle sleeve is pressed down before pressing the green activator button.  Continue holding the pen against your skin for 15 sections, until you hear a second click, or until the yellow indicator has filled the medication inspection window.  The injection is now complete.  Slowly pull the pen straight out from your skin and dispose of the used pen immediately in your sharps disposal container.   If you see any blood at the injection site, press a cotton ball or gauze on the site and maintain pressure until the bleeding stops, do not rub the injection site    On-Body Injector  Gather all supplies needed for injection on a clean, flat working surface: Plastic tray containing the On-body injector and prefilled cartridge, alcohol swab, sharps container, etc.  Remove unopened carton from the refrigerator and allow it to warm up to room temperature for at least 45 but no more than 90 minutes.  Look at the medication label - look for correct medication, correct dose, and check the expiration date  Remove the On-body injector and prefilled cartridge from plastic tray  Look at the On-body injector - check that it is intact and undamaged. Do NOT close the gray door before the prefilled cartridge is loaded  Look at the prefilled cartridge - the liquid should appear clear and colorless to slightly yellow, you may see tiny white or clear particles. Do NOT twist or remove cartridge top.  Use an alcohol swab to clean the smaller bottom tip of the prefilled cartridge and let it air dry. Do not touch the smaller bottom tip after cleaning.  Insert the smaller bottom tip into the On-Body injector first. Firmly push down until you hear a click. There may be a few drops of medicine on the back of the On-body Injector. That is normal. Close the gray door and squeeze firmly until it snaps closed.  You MUST start the injection within 5 minutes of loading the prefilled cartridge.   Select injection site - you can use the front of your thigh or your belly (but not the area 2 inches around your belly button)  Prepare injection site - wash your hands and clean the skin at the injection site with an alcohol swab and let it air dry, do not touch the injection site again before the injection  Peel the green tabs on the back of the On-body injector to expose the adhesive. This will activate the On-body injector and cause the status light to turn BLUE.   For the belly, move and hold the skin to create a firm, flat surface. You do not need to pull the skin if injecting on the front of the thighs.  When the blue light flashes, it is ready to start the injection.  Place the On-body injector onto the cleaned skin and then firmly press the gray button until you hear a click. This will start the injection and the light will continuously flash GREEN. This may take up to 5 minutes to complete the full dose.  The On-body Injector will automatically stop when the injection is finished. You will hear beeps and the status light will change to SOLID GREEN.   Remove the On-Body Injector by carefully peeling the adhesive from your skin. Avoid touching the needle or needle cover on the back of the On-Body Injector. You will hear several beeps and the status light will turn off.   Dispose of the used On-Body Injector immediately in Engineer, water.      Adherence/Missed dose instructions:  If your injection is given more than 7 days after your scheduled injection date - consult your pharmacist for additional instructions on how to adjust your dosing schedule.    Goals of Therapy     Crohn's Disease  Achieve remission of symptoms  Maintain remission of symptoms  Minimize long-term systemic glucocorticoid use  Prevent need for surgical procedures  Maintenance of effective psychosocial functioning    Plaque Psoriasis  Minimize areas of skin involvement (% BSA)  Avoidance of long term glucocorticoid use  Maintenance of effective psychosocial functioning    Psoriatic Arthritis  Achieve remission/inactive disease or low/minimal disease activity  Maintenance of function  Minimization of systemic manifestations and comorbidities  Maintenance of effective psychosocial functioning      Side Effects & Monitoring Parameters     Injection site reaction (redness, irritation, inflammation localized to the site of administration)  Signs of a common cold - minor sore throat, runny or stuffy nose, etc.  Felling tired/weak  Headache  Stomach, joint or back pain    The following side effects should be reported to the provider:  Signs of a hypersensitivity reaction - rash; hives; itching; red, swollen, blistered, or peeling skin; wheezing; tightness in the chest or throat; difficulty breathing, swallowing, or talking; swelling of the mouth, face, lips, tongue, or throat; etc.  Reduced immune function - report signs of infection such as fever; chills; body aches; very bad sore throat; ear or sinus pain; cough; more sputum or change in color of sputum; pain with passing urine; wound that will not heal, etc.  Also at a slightly higher risk of some malignancies (mainly skin and blood cancers) due to this reduced immune function.  In the case of signs of infection - the patient should hold the next dose of Skyrizi?? and call your primary care provider to ensure adequate  medical care.  Treatment may be resumed when infection is treated and patient is asymptomatic.  Flu-like symptoms  Warm, red, or painful skin or sores on the body  Severe diarrhea or stomach pain      Contraindications, Warnings, & Precautions     Have your bloodwork checked as you have been told by your prescriber  Talk with your doctor if you are pregnant, planning to become pregnant, or breastfeeding  Discuss the possible need for holding your dose(s) of Skyrizi?? when a planned procedure is scheduled with the prescriber as it may delay healing/recovery timeline       Drug/Food Interactions     Medication list reviewed in Epic. The patient was instructed to inform the care team before taking any new medications or supplements. No drug interactions identified.   Talk with you prescriber or pharmacist before receiving any live vaccinations while taking this medication and after you stop taking it    Storage, Handling Precautions, & Disposal     Store this medication in the refrigerator.  Do not freeze   If needed, you may store at room temperature for up to 24 hours  Store in original packaging, protected from light  Do not shake  Dispose of used syringes/pens in a sharps disposal container          Current Medications (including OTC/herbals), Comorbidities and Allergies     Current Outpatient Medications   Medication Sig Dispense Refill    ALPRAZolam (XANAX) 0.25 MG tablet take 1 tablet by mouth once daily if needed for anxiety  0    citalopram (CELEXA) 10 MG tablet       codeine-guaiFENesin (GUAIFENESIN AC) 10-100 mg/5 mL liquid Take 5 mL by mouth Three (3) times a day as needed for cough. (Patient not taking: Reported on 11/05/2022) 118 mL 0    risankizumab-rzaa (SKYRIZI) 150 mg/mL Syrg Inject the contents of 1 syringe (150 mg total) under the skin every 12 weeks. 1 mL 3    risankizumab-rzaa (SKYRIZI) 150 mg/mL Syrg Inject the contents of 1 syringe (150 mg total) under the skin every 12 weeks. 1 mL 3    triamcinolone (KENALOG) 0.1 % ointment Apply twice a day to affected areas 80 g 3    valACYclovir (VALTREX) 500 MG tablet        No current facility-administered medications for this visit.       No Known Allergies    Patient Active Problem List   Diagnosis    Acute frontal sinusitis    Psoriasis    Vaginal dryness       Reviewed and up to date in Epic.    Appropriateness of Therapy     Acute infections noted within Epic:  No active infections  Patient reported infection: None    Is the medication and dose appropriate based on diagnosis, medication list, comorbidities, allergies, medical history, patient???s ability to self-administer the medication, and therapeutic goals? Yes    Prescription has been clinically reviewed: Yes      Baseline Quality of Life Assessment      How many days over the past month did your Psoriasis  keep you from your normal activities? For example, brushing your teeth or getting up in the morning. Patient declined to answer    Financial Information     Medication Assistance provided: Prior Authorization    Anticipated copay of $0 / 84 days  reviewed with patient. Verified delivery address.    Delivery Information  Scheduled delivery date: N/A    Expected start date: N/A    Patient does not need another dose until 06/28/2023. Setting up the [facility] and [place] for 06/10/2023.    Explained the services we provide at [facility] and that each month we would call to set up refills.  Stressed importance of returning phone calls so that we could ensure they receive their medications in time each month.  Informed patient that we should be setting up refills 7-10 days prior to when they will run out of medication.  A pharmacist will reach out to perform a clinical assessment periodically.  Informed patient that a welcome packet, containing information about our pharmacy and other support services, a Notice of Privacy Practices, and a drug information handout will be sent.      The patient or caregiver noted above participated in the development of this care plan and knows that they can request review of or adjustments to the care plan at any time.      Patient or caregiver verbalized understanding of the above information as well as how to contact the pharmacy at [phone] option 4 with any questions/concerns.  The pharmacy is open Monday through Friday 8:30am-4:30pm.  A pharmacist is available 24/7 via pager to answer any clinical questions they may have.    Patient Specific Needs     Does the patient have any physical, cognitive, or cultural barriers? No    Does the patient have adequate living arrangements? (i.e. the ability to store and take their medication appropriately) Yes    Did you identify any home environmental safety or security hazards? No    Patient prefers to have medications discussed with  Patient     Is the patient or caregiver able to read and understand education materials at a high school level or above? Yes    Patient's primary language is  English     Is the patient high risk? No    SOCIAL DETERMINANTS OF HEALTH     At the [facility], we have learned that life circumstances - like trouble affording food, housing, utilities, or transportation can affect the health of many of our patients.   That is why we wanted to ask: are you currently experiencing any life circumstances that are negatively impacting your health and/or quality of life? Patient declined to answer    Social Determinants of Health     Financial Resource Strain: Not on file   Internet Connectivity: Not on file   Food Insecurity: Not on file   Tobacco Use: Low Risk  (11/06/2022)    Patient History     Smoking Tobacco Use: Never     Smokeless Tobacco Use: Never     Passive Exposure: Never   Housing/Utilities: Unknown (07/24/2021)    Housing/Utilities     Within the past 12 months, have you ever stayed: outside, in a car, in a tent, in an overnight shelter, or temporarily in someone else's home (i.e. couch-surfing)?: No     Are you worried about losing your housing?: Not on file     Within the past 12 months, have you been unable to get utilities (heat, electricity) when it was really needed?: Not on file   Alcohol Use: Not on file   Transportation Needs: Not on file   Substance Use: Not on file   Health Literacy: Low Risk  (07/24/2021)    Health Literacy     : Never   Physical  Activity: Not on file   Interpersonal Safety: Unknown (05/22/2023)    Interpersonal Safety     Unsafe Where You Currently Live: Not on file     Physically Hurt by Anyone: Not on file     Abused by Anyone: Not on file   Stress: Not on file   Intimate Partner Violence: Not on file   Depression: Not at risk (09/06/2022)    PHQ-2     PHQ-2 Score: 0   Social Connections: Not on file       Would you be willing to receive help with any of the needs that you have identified today? Not applicable       Elnora Morrison, PharmD  [facility] Specialty Pharmacist

## 2023-05-27 ENCOUNTER — Encounter: Payer: Self-pay | Admitting: Family Medicine

## 2023-05-27 ENCOUNTER — Ambulatory Visit: Payer: BLUE CROSS/BLUE SHIELD | Admitting: Family Medicine

## 2023-05-27 VITALS — BP 112/68 | HR 65 | Temp 97.9°F | Resp 16 | Ht 68.0 in | Wt 156.4 lb

## 2023-05-27 DIAGNOSIS — F39 Unspecified mood [affective] disorder: Secondary | ICD-10-CM | POA: Diagnosis not present

## 2023-05-27 DIAGNOSIS — R413 Other amnesia: Secondary | ICD-10-CM | POA: Diagnosis not present

## 2023-05-27 DIAGNOSIS — B001 Herpesviral vesicular dermatitis: Secondary | ICD-10-CM | POA: Diagnosis not present

## 2023-05-27 LAB — CBC
HCT: 42.4 % (ref 36.0–46.0)
Hemoglobin: 13.7 g/dL (ref 12.0–15.0)
MCHC: 32.4 g/dL (ref 30.0–36.0)
MCV: 96.1 fl (ref 78.0–100.0)
Platelets: 284 10*3/uL (ref 150.0–400.0)
RBC: 4.42 Mil/uL (ref 3.87–5.11)
RDW: 14.1 % (ref 11.5–15.5)
WBC: 4.8 10*3/uL (ref 4.0–10.5)

## 2023-05-27 LAB — COMPREHENSIVE METABOLIC PANEL
ALT: 10 U/L (ref 0–35)
AST: 19 U/L (ref 0–37)
Albumin: 4.5 g/dL (ref 3.5–5.2)
Alkaline Phosphatase: 54 U/L (ref 39–117)
BUN: 16 mg/dL (ref 6–23)
CO2: 29 mEq/L (ref 19–32)
Calcium: 9.5 mg/dL (ref 8.4–10.5)
Chloride: 101 mEq/L (ref 96–112)
Creatinine, Ser: 0.86 mg/dL (ref 0.40–1.20)
GFR: 72.19 mL/min (ref >60.00)
Glucose, Bld: 79 mg/dL (ref 70–99)
Potassium: 3.9 mEq/L (ref 3.5–5.1)
Sodium: 139 mEq/L (ref 135–145)
Total Bilirubin: 0.7 mg/dL (ref 0.2–1.2)
Total Protein: 7.1 g/dL (ref 6.0–8.3)

## 2023-05-27 LAB — FOLATE: Folate: 23.2 ng/mL (ref >5.9)

## 2023-05-27 LAB — VITAMIN B12: Vitamin B-12: 506 pg/mL (ref 211–911)

## 2023-05-27 LAB — TSH: TSH: 1.28 u[IU]/mL (ref 0.35–5.50)

## 2023-05-27 LAB — HEMOGLOBIN A1C: Hgb A1c MFr Bld: 5.5 % (ref 4.6–6.5)

## 2023-05-27 MED ORDER — VALACYCLOVIR HCL 500 MG PO TABS: ORAL_TABLET | ORAL | 3 refills | Status: AC

## 2023-05-27 NOTE — Patient Instructions (Addendum)
It was a pleasure meeting you today. Thank you for allowing me to take part in your health care.  Our goals for today as we discussed include:  Good sleep habits (sometimes referred to as "sleep hygiene") can help you get a good night's sleep. Some habits that can improve your sleep health:  Be consistent. Go to bed at the same time each night and get up at the same time each morning, including on the weekends Make sure your bedroom is quiet, dark, relaxing, and at a comfortable temperature Remove electronic devices, such as TVs, computers, and smart phones, from the bedroom Avoid large meals, caffeine, and alcohol before bedtime Get some exercise. Being physically active during the day can help you fall asleep more easily at night.   Decrease alcohol intake  Continue Celexa 10 mg daily  Increase activity  We will get some labs today.  If they are abnormal or we need to do something about them, I will call you.  If they are normal, I will send you a message on MyChart (if it is active) or a letter in the mail.  If you don't hear from [place] in 2 weeks, please call the office at the number below.   Follow up in 4 weeks   If you have any questions or concerns, please do not hesitate to call the office at [phone].  I look forward to our next visit and until then take care and stay safe.  Regards,   Dana Allan, MD   [facility]

## 2023-05-27 NOTE — Progress Notes (Signed)
SUBJECTIVE:   Chief Complaint  Patient presents with   Memory Loss    Over the last year.   HPI Presents to clinic for acute visit  Concern of decrease in memory Symptoms noticed 1 year ago but felt to have worsened in the past few months.  Endorses increase in forgetfulness, having to have things repeated and feels like thoughts are racing.  Concerned as she feels that when she is told something she forgets this when mentioned at later dates.  She is worried may be Alzheimer's as mother was diagnosed with early Alzheimer's at age 49.  She remains active and works.  She tries to eat healthy.  Her husband, who is with her today, reports that she seems to be more forgetful when she has an increase in anxiety.  Reports worse when talking to her older sister as she seems to be anxious when this occurs.  Also  notes that patient has a lot of increased stress in life.  Husband is dealing with cancer, things happening at work and other family concerns.  Does not sleep well at night.  EtOH nightly prior to bed.  She was prescribed Celexa previously but had not been taking medication. Recently restarted 1-2 weeks ago.   Able to complete tasks, finances, cook, clean, has not gotten lost while driving.  Recognizes familiar places and faces.   PERTINENT PMH / PSH: Mood disorder  OBJECTIVE:  BP 112/68   Pulse 65   Temp 97.9 F (36.6 C)   Resp 16   Ht 5\' 8"  (1.727 m)   Wt 156 lb 6 oz (70.9 kg)   SpO2 98%   BMI 23.78 kg/m    Physical Exam Vitals reviewed.  Constitutional:      General: She is not in acute distress.    Appearance: Normal appearance. She is normal weight. She is not ill-appearing, toxic-appearing or diaphoretic.  Eyes:     General:        Right eye: No discharge.        Left eye: No discharge.     Conjunctiva/sclera: Conjunctivae normal.  Cardiovascular:     Rate and Rhythm: Normal rate and regular rhythm.     Heart sounds: Normal heart sounds.  Pulmonary:     Effort:  Pulmonary effort is normal.     Breath sounds: Normal breath sounds.  Abdominal:     General: Bowel sounds are normal.  Musculoskeletal:        General: Normal range of motion.  Skin:    General: Skin is warm and dry.  Neurological:     General: No focal deficit present.     Mental Status: She is alert and oriented to person, place, and time. Mental status is at baseline.  Psychiatric:        Attention and Perception: Attention normal.        Mood and Affect: Mood is anxious.        Speech: Speech normal.        Behavior: Behavior normal.        Thought Content: Thought content normal. Thought content does not include homicidal or suicidal ideation. Thought content does not include homicidal or suicidal plan.        Cognition and Memory: Cognition and memory normal.        Judgment: Judgment normal.     Comments: Mini Cog score 10        05/27/2023   11:12 AM 11/11/2022  1:46 PM 08/19/2022    2:12 PM 09/25/2021    8:37 AM 09/21/2020    8:29 AM  Depression screen PHQ 2/9  Decreased Interest 1 0 1 0 0  Down, Depressed, Hopeless 1 1 0 0 1  PHQ - 2 Score 2 1 1  0 1  Altered sleeping 0 1   0  Tired, decreased energy 0 0   0  Change in appetite 1 0   0  Feeling bad or failure about yourself  1 0   0  Trouble concentrating 1 0   0  Moving slowly or fidgety/restless 0 0   0  Suicidal thoughts 0 0   0  PHQ-9 Score 5 2   1   Difficult doing work/chores Somewhat difficult Not difficult at all   Not difficult at all      05/27/2023   11:12 AM 11/11/2022    1:47 PM 09/21/2020    8:29 AM 02/09/2020    9:17 AM  GAD 7 : Generalized Anxiety Score  Nervous, Anxious, on Edge 2 1 0 1  Control/stop worrying 1 1 0 0  Worry too much - different things 1 1 0 0  Trouble relaxing 0 1 0 0  Restless 1 0 0 0  Easily annoyed or irritable 0 0 0 0  Afraid - awful might happen 0 0 0 1  Total GAD 7 Score 5 4 0 2  Anxiety Difficulty Somewhat difficult Not difficult at all Not difficult at all Not  difficult at all    ASSESSMENT/PLAN:  Decline in verbal memory Assessment & Plan: Progressive forgetfulness  Mini Cog score 5 Suspect stress/anxiety/lack of sleep contributing to decline in memory Will check labs today Restart SSRI Ensure adequate sleep Decrease EtOH intake Follow up in 4 weeks, if no improvement will refer to Neurology   Orders: -     TSH -     Vitamin B12 -     CBC -     Comprehensive metabolic panel -     Folate -     Hemoglobin A1c  Herpes labialis Assessment & Plan: Chronic No recent flare Refill Valtrex   Orders: -     valACYclovir HCl; TAKE 1 TABLET(500 MG) BY MOUTH TWICE DAILY FOR 3 TO 7 DAYS AS NEEDED FOR OUTBREAK  Dispense: 90 tablet; Refill: 3  Mood disorder (HCC) Assessment & Plan: Chronic.  Suspect increasing anxiety contributing to  forgetfulness. Had not been taking Celexa.  Denies SI/HI Restart Celexa 10 mg daily. Recommend CBT Mental health education provided Follow-up 4 weeks.     PDMP reviewed  Return in about 4 weeks (around 06/24/2023) for PCP.  Dana Allan, MD

## 2023-06-04 ENCOUNTER — Encounter (INDEPENDENT_AMBULATORY_CARE_PROVIDER_SITE_OTHER): Payer: Self-pay

## 2023-06-05 NOTE — Unmapped (Signed)
[facility] Clinical Assessment & Refill Coordination Note    Denise Richards, DOB: 03-24-1961  Phone: [phone] (work)    All above HIPAA information was verified with patient.     Was a Nurse, learning disability used for this call? No    Specialty Medication(s):   Inflammatory Disorders: Skyrizi     Current Outpatient Medications   Medication Sig Dispense Refill    ALPRAZolam (XANAX) 0.25 MG tablet take 1 tablet by mouth once daily if needed for anxiety  0    citalopram (CELEXA) 10 MG tablet       codeine-guaiFENesin (GUAIFENESIN AC) 10-100 mg/5 mL liquid Take 5 mL by mouth Three (3) times a day as needed for cough. (Patient not taking: Reported on 11/05/2022) 118 mL 0    risankizumab-rzaa (SKYRIZI) 150 mg/mL Syrg Inject the contents of 1 syringe (150 mg total) under the skin every 12 weeks. 1 mL 3    risankizumab-rzaa (SKYRIZI) 150 mg/mL Syrg Inject the contents of 1 syringe (150 mg total) under the skin every 12 weeks. 1 mL 3    triamcinolone (KENALOG) 0.1 % ointment Apply twice a day to affected areas 80 g 3    valACYclovir (VALTREX) 500 MG tablet        No current facility-administered medications for this visit.        Changes to medications: Jolett reports no changes at this time.    No Known Allergies    Changes to allergies: No    SPECIALTY MEDICATION ADHERENCE     Skyrizi 150mg /mL : 0 doses of medicine on hand     Medication Adherence    Patient reported X missed doses in the last month: 0  Specialty Medication: Skyrizi 150mg /mL  Informant: patient  Confirmed plan for next specialty medication refill: delivery by pharmacy  Refills needed for supportive medications: not needed          Specialty medication(s) dose(s) confirmed: Regimen is correct and unchanged.     Are there any concerns with adherence? No    Adherence counseling provided? Not needed    CLINICAL MANAGEMENT AND INTERVENTION      Clinical Benefit Assessment:    Do you feel the medicine is effective or helping your condition? Yes    Clinical Benefit counseling provided? Not needed    Adverse Effects Assessment:    Are you experiencing any side effects? No    Are you experiencing difficulty administering your medicine? No    Quality of Life Assessment:    Quality of Life    Rheumatology  Oncology  Dermatology  1. What impact has your specialty medication had on the symptoms of your skin condition (i.e. itchiness, soreness, stinging)?: Tremendous  2. What impact has your specialty medication had on your comfort level with your skin?: Tremendous  Cystic Fibrosis          How many days over the past month did your Psoriasis  keep you from your normal activities? For example, brushing your teeth or getting up in the morning. Patient declined to answer    Have you discussed this with your provider? Not needed    Acute Infection Status:    Acute infections noted within Epic:  No active infections  Patient reported infection: None    Therapy Appropriateness:    Is therapy appropriate based on current medication list, adverse reactions, adherence, clinical benefit and progress toward achieving therapeutic goals? Yes, therapy is appropriate and should be continued  DISEASE/MEDICATION-SPECIFIC INFORMATION      For patients on injectable medications: Patient currently has 0 doses left.  Next injection is scheduled for 06/28/2023.    Chronic Inflammatory Diseases: Have you experienced any flares in the last month? No  Has this been reported to your provider? Not applicable    PATIENT SPECIFIC NEEDS     Does the patient have any physical, cognitive, or cultural barriers? No    Is the patient high risk? No    Did the patient require a clinical intervention? No    Does the patient require physician intervention or other additional services (i.e., nutrition, smoking cessation, social work)? No    SOCIAL DETERMINANTS OF HEALTH     At the [facility], we have learned that life circumstances - like trouble affording food, housing, utilities, or transportation can affect the health of many of our patients.   That is why we wanted to ask: are you currently experiencing any life circumstances that are negatively impacting your health and/or quality of life? Patient declined to answer    Social Determinants of Health     Financial Resource Strain: Not on file   Internet Connectivity: Not on file   Food Insecurity: Not on file   Tobacco Use: Low Risk  (11/06/2022)    Patient History     Smoking Tobacco Use: Never     Smokeless Tobacco Use: Never     Passive Exposure: Never   Housing/Utilities: Unknown (07/24/2021)    Housing/Utilities     Within the past 12 months, have you ever stayed: outside, in a car, in a tent, in an overnight shelter, or temporarily in someone else's home (i.e. couch-surfing)?: No     Are you worried about losing your housing?: Not on file     Within the past 12 months, have you been unable to get utilities (heat, electricity) when it was really needed?: Not on file   Alcohol Use: Not on file   Transportation Needs: Not on file   Substance Use: Not on file   Health Literacy: Low Risk  (07/24/2021)    Health Literacy     : Never   Physical Activity: Not on file   Interpersonal Safety: Unknown (06/05/2023)    Interpersonal Safety     Unsafe Where You Currently Live: Not on file     Physically Hurt by Anyone: Not on file     Abused by Anyone: Not on file   Stress: Not on file   Intimate Partner Violence: Not on file   Depression: Not at risk (09/06/2022)    PHQ-2     PHQ-2 Score: 0   Social Connections: Not on file       Would you be willing to receive help with any of the needs that you have identified today? Not applicable       SHIPPING     Specialty Medication(s) to be Shipped:   Inflammatory Disorders: Skyrizi    Other medication(s) to be shipped: No additional medications requested for fill at this time     Changes to insurance: No    Delivery Scheduled: Yes, Expected medication delivery date: 06/10/2023.     Medication will be delivered via Same Day Courier to the confirmed prescription address in [facility].    The patient will receive a drug information handout for each medication shipped and additional FDA Medication Guides as required.  Verified that patient has previously received a Conservation officer, historic buildings and a Technical sales engineer  Practices.    The patient or caregiver noted above participated in the development of this care plan and knows that they can request review of or adjustments to the care plan at any time.      All of the patient's questions and concerns have been addressed.    Elnora Morrison, PharmD   [facility] Specialty Pharmacist

## 2023-06-12 ENCOUNTER — Encounter: Payer: Self-pay | Admitting: Family Medicine

## 2023-06-12 DIAGNOSIS — R413 Other amnesia: Secondary | ICD-10-CM | POA: Insufficient documentation

## 2023-06-12 DIAGNOSIS — B001 Herpesviral vesicular dermatitis: Secondary | ICD-10-CM | POA: Insufficient documentation

## 2023-06-12 NOTE — Assessment & Plan Note (Signed)
Chronic.  Suspect increasing anxiety contributing to  forgetfulness. Had not been taking Celexa.  Denies SI/HI Restart Celexa 10 mg daily. Recommend CBT Mental health education provided Follow-up 4 weeks.

## 2023-06-12 NOTE — Assessment & Plan Note (Signed)
Progressive forgetfulness  Mini Cog score 5 Suspect stress/anxiety/lack of sleep contributing to decline in memory Will check labs today Restart SSRI Ensure adequate sleep Decrease EtOH intake Follow up in 4 weeks, if no improvement will refer to Neurology

## 2023-06-12 NOTE — Assessment & Plan Note (Signed)
Chronic No recent flare Refill Valtrex

## 2023-06-24 ENCOUNTER — Encounter: Payer: Self-pay | Admitting: Family Medicine

## 2023-06-24 ENCOUNTER — Ambulatory Visit (INDEPENDENT_AMBULATORY_CARE_PROVIDER_SITE_OTHER): Payer: BLUE CROSS/BLUE SHIELD | Admitting: Family Medicine

## 2023-06-24 VITALS — BP 114/68 | HR 63 | Temp 97.9°F | Resp 16 | Ht 68.0 in | Wt 160.2 lb

## 2023-06-24 DIAGNOSIS — Z1211 Encounter for screening for malignant neoplasm of colon: Secondary | ICD-10-CM

## 2023-06-24 DIAGNOSIS — F39 Unspecified mood [affective] disorder: Secondary | ICD-10-CM | POA: Diagnosis not present

## 2023-06-24 DIAGNOSIS — Z23 Encounter for immunization: Secondary | ICD-10-CM | POA: Diagnosis not present

## 2023-06-24 DIAGNOSIS — R413 Other amnesia: Secondary | ICD-10-CM | POA: Diagnosis not present

## 2023-06-24 DIAGNOSIS — R399 Unspecified symptoms and signs involving the genitourinary system: Secondary | ICD-10-CM

## 2023-06-24 LAB — POCT URINALYSIS DIPSTICK (MANUAL)
Leukocytes, UA: NEGATIVE
Nitrite, UA: NEGATIVE
Poct Bilirubin: NEGATIVE
Poct Blood: NEGATIVE
Poct Glucose: NORMAL mg/dL
Poct Ketones: NEGATIVE
Poct Protein: NEGATIVE mg/dL
Poct Urobilinogen: NORMAL mg/dL
Spec Grav, UA: 1.025 (ref 1.010–1.025)
pH, UA: 5.5 (ref 5.0–8.0)

## 2023-06-24 NOTE — Progress Notes (Signed)
SUBJECTIVE:   Chief Complaint  Patient presents with   Medical Management of Chronic Issues   HPI Presents to clinic for follow up chronic disease management   Mood disorder Doing much better since restarting Celexa daily.  Memory has improved.  EtOH intake decreased.  Sleep has improved.  Denies SI/HI Recent labs reassuring   UTI Symptoms ongoing over 1 month Urinary burning sensation No vaginal discharge.  Endorses dyspareunia.  Previously taken Estrogen. Denies any abdominal pain, fevers, frequency. Tried OTC vaginal cream for yeast infection, some mild relief.   PERTINENT PMH / PSH: Mood disorder  OBJECTIVE:  BP 114/68   Pulse 63   Temp 97.9 F (36.6 C)   Resp 16   Ht 5\' 8"  (1.727 m)   Wt 160 lb 4 oz (72.7 kg)   SpO2 98%   BMI 24.37 kg/m    Physical Exam Vitals reviewed.  Constitutional:      General: She is not in acute distress.    Appearance: Normal appearance. She is normal weight. She is not ill-appearing, toxic-appearing or diaphoretic.  Eyes:     General:        Right eye: No discharge.        Left eye: No discharge.     Conjunctiva/sclera: Conjunctivae normal.  Cardiovascular:     Rate and Rhythm: Normal rate and regular rhythm.     Heart sounds: Normal heart sounds.  Pulmonary:     Effort: Pulmonary effort is normal.     Breath sounds: Normal breath sounds.  Abdominal:     General: Bowel sounds are normal.  Musculoskeletal:        General: Normal range of motion.  Skin:    General: Skin is warm and dry.  Neurological:     General: No focal deficit present.     Mental Status: She is alert and oriented to person, place, and time. Mental status is at baseline.  Psychiatric:        Attention and Perception: Attention normal.        Mood and Affect: Mood normal.        Speech: Speech normal.        Behavior: Behavior normal.        Thought Content: Thought content normal. Thought content does not include homicidal or suicidal ideation.  Thought content does not include homicidal or suicidal plan.        Cognition and Memory: Cognition and memory normal.        Judgment: Judgment normal.        06/24/2023    1:22 PM 05/27/2023   11:12 AM 11/11/2022    1:46 PM 08/19/2022    2:12 PM 09/25/2021    8:37 AM  Depression screen PHQ 2/9  Decreased Interest  1 0 1 0  Down, Depressed, Hopeless 0 1 1 0 0  PHQ - 2 Score 0 2 1 1  0  Altered sleeping 0 0 1    Tired, decreased energy 0 0 0    Change in appetite 1 1 0    Feeling bad or failure about yourself  0 1 0    Trouble concentrating 0 1 0    Moving slowly or fidgety/restless 0 0 0    Suicidal thoughts 0 0 0    PHQ-9 Score 1 5 2     Difficult doing work/chores Not difficult at all Somewhat difficult Not difficult at all        06/24/2023  1:22 PM 05/27/2023   11:12 AM 11/11/2022    1:47 PM 09/21/2020    8:29 AM  GAD 7 : Generalized Anxiety Score  Nervous, Anxious, on Edge 1 2 1  0  Control/stop worrying 0 1 1 0  Worry too much - different things 0 1 1 0  Trouble relaxing 1 0 1 0  Restless 0 1 0 0  Easily annoyed or irritable 1 0 0 0  Afraid - awful might happen 0 0 0 0  Total GAD 7 Score 3 5 4  0  Anxiety Difficulty Somewhat difficult Somewhat difficult Not difficult at all Not difficult at all    ASSESSMENT/PLAN:  Mood disorder [facility]) Assessment & Plan: Chronic. Doing well on current medication.  Symptoms improved.    Denies SI/HI Continue Celexa 10 mg daily. Recent labs reassuring Follow up as needed     Colon cancer screening -     Ambulatory referral to Gastroenterology  Need for vaccination for H flu type B -     Flu vaccine trivalent PF, 6mos and older(Flulaval,Afluria,Fluarix,Fluzone)  UTI symptoms Assessment & Plan: Symptoms ongoing for more than 1 month Dysuria, dyspareunia, and vaginal itching Mild relief with OTC yeast cream Urine POC  Orders: -     POCT Urinalysis Dip Manual  Need for shingles vaccine -     Varicella-zoster vaccine  IM  Decline in verbal memory Assessment & Plan: Improved with reinitiating of Celexa. EtOH has decreased Sleep has improved. Recent labs reassuring         PDMP reviewed  Return if symptoms worsen or fail to improve, for PCP.  Dana Allan, MD

## 2023-06-24 NOTE — Assessment & Plan Note (Signed)
Chronic. Doing well on current medication.  Symptoms improved.    Denies SI/HI Continue Celexa 10 mg daily. Recent labs reassuring Follow up as needed

## 2023-06-24 NOTE — Assessment & Plan Note (Signed)
Improved with reinitiating of Celexa. EtOH has decreased Sleep has improved. Recent labs reassuring

## 2023-06-24 NOTE — Assessment & Plan Note (Signed)
Symptoms ongoing for more than 1 month Dysuria, dyspareunia, and vaginal itching Mild relief with OTC yeast cream Urine POC

## 2023-06-24 NOTE — Patient Instructions (Addendum)
It was a pleasure meeting you today. Thank you for allowing me to take part in your health care.  Our goals for today as we discussed include: Glad you are feeling better  Recent blood work normal  Continue Celexa 10 mg daily  Urine negative  Received Shingles first dose today.  Schedule second vaccine in 2 months in RN clinic  Received Flu vaccine today  Referral sent to [facility] for colonoscopy.  . Call the office at [phone] to schedule   Follow up as needed  If you have any questions or concerns, please do not hesitate to call the office at [phone].  I look forward to our next visit and until then take care and stay safe.  Regards,   Dana Allan, MD   [facility]

## 2023-06-26 ENCOUNTER — Telehealth: Payer: Self-pay

## 2023-06-26 ENCOUNTER — Other Ambulatory Visit: Payer: Self-pay

## 2023-06-26 DIAGNOSIS — Z1211 Encounter for screening for malignant neoplasm of colon: Secondary | ICD-10-CM

## 2023-06-26 MED ORDER — NA SULFATE-K SULFATE-MG SULF 17.5-3.13-1.6 GM/177ML PO SOLN
1.0000 | Freq: Once | ORAL | 0 refills | Status: AC
Start: 2023-06-26 — End: 2023-06-26

## 2023-06-26 NOTE — Telephone Encounter (Signed)
Gastroenterology Pre-Procedure Review  Request Date: 08/10/23 Requesting Physician: Dr. Allegra Lai  PATIENT REVIEW QUESTIONS: The patient responded to the following health history questions as indicated:    1. Are you having any GI issues? no 2. Do you have a personal history of Polyps? no 3. Do you have a family history of Colon Cancer or Polyps? no 4. Diabetes Mellitus? no 5. Joint replacements in the past 12 months?no 6. Major health problems in the past 3 months?no 7. Any artificial heart valves, MVP, or defibrillator?no    MEDICATIONS & ALLERGIES:    Patient reports the following regarding taking any anticoagulation/antiplatelet therapy:   Plavix, Coumadin, Eliquis, Xarelto, Lovenox, Pradaxa, Brilinta, or Effient? no Aspirin? no  Patient confirms/reports the following medications:  Current Outpatient Medications  Medication Sig Dispense Refill   CALCIUM PO Take by mouth.     citalopram (CELEXA) 10 MG tablet TAKE 1 TABLET(10 MG) BY MOUTH DAILY IN THE MORNING 90 tablet 1   Multiple Vitamin (MULTIVITAMIN) tablet Take 1 tablet by mouth daily.     SKYRIZI, 150 MG DOSE, 75 MG/0.83ML PSKT      valACYclovir (VALTREX) 500 MG tablet TAKE 1 TABLET(500 MG) BY MOUTH TWICE DAILY FOR 3 TO 7 DAYS AS NEEDED FOR OUTBREAK 90 tablet 3   VITAMIN E PO Take by mouth.     No current facility-administered medications for this visit.    Patient confirms/reports the following allergies:  No Known Allergies  No orders of the defined types were placed in this encounter.   AUTHORIZATION INFORMATION Primary Insurance: 1D#: Group #:  Secondary Insurance: 1D#: Group #:  SCHEDULE INFORMATION: Date: 08/10/23 Time: Location: [facility]

## 2023-06-29 ENCOUNTER — Telehealth: Payer: Self-pay

## 2023-06-29 NOTE — Telephone Encounter (Signed)
Per pt cancel procedure due to insurance is out of network.

## 2023-07-01 NOTE — Unmapped (Signed)
Received message on nurse line from [facility] requesting valid rx for [employer].   TC to [employer]; Nyra Capes is being filled by [facility] [employer] and 84 day supply has been shipped.   TC to patient. She confirms she has received her medication from [facility] and she will reach out if she runs in to any issues when it is time to request a refill.  Mrs Munns confirmed insurance we have on file for her is correct.

## 2023-07-08 ENCOUNTER — Encounter: Payer: Self-pay | Admitting: Family Medicine

## 2023-07-22 ENCOUNTER — Other Ambulatory Visit: Payer: Self-pay | Admitting: Family Medicine

## 2023-07-22 DIAGNOSIS — F32A Depression, unspecified: Secondary | ICD-10-CM

## 2023-08-10 ENCOUNTER — Ambulatory Visit: Admit: 2023-08-10 | Payer: BLUE CROSS/BLUE SHIELD | Admitting: Gastroenterology

## 2023-08-10 SURGERY — COLONOSCOPY WITH PROPOFOL
Anesthesia: General

## 2023-08-31 ENCOUNTER — Ambulatory Visit (INDEPENDENT_AMBULATORY_CARE_PROVIDER_SITE_OTHER): Payer: BLUE CROSS/BLUE SHIELD

## 2023-08-31 DIAGNOSIS — Z23 Encounter for immunization: Secondary | ICD-10-CM | POA: Diagnosis not present

## 2023-08-31 NOTE — Progress Notes (Signed)
Pt presented to have their 2nd shingles vaccine. Pt was identified through two identifiers. Pt tolerated the injection well in the left deltoid.

## 2023-09-03 NOTE — Unmapped (Signed)
[facility] Refill Coordination Note    Specialty Medication(s) to be Shipped:   Inflammatory Disorders: Skyrizi    Other medication(s) to be shipped: No additional medications requested for fill at this time     Denise Richards, DOB: 10-04-1961  Phone: [phone] (work)      All above HIPAA information was verified with patient.     Was a Nurse, learning disability used for this call? No    Completed refill call assessment today to schedule patient's medication shipment from the [facility]  [phone]).  All relevant notes have been reviewed.     Specialty medication(s) and dose(s) confirmed: Regimen is correct and unchanged.   Changes to medications: Lunna reports no changes at this time.  Changes to insurance: No  New side effects reported not previously addressed with a pharmacist or physician: None reported  Questions for the pharmacist: No    Confirmed patient received a Conservation officer, historic buildings and a Surveyor, mining with first shipment. The patient will receive a drug information handout for each medication shipped and additional FDA Medication Guides as required.       DISEASE/MEDICATION-SPECIFIC INFORMATION        For patients on injectable medications: Patient currently has 0 doses left.  Next injection is scheduled for 12/4-5.    SPECIALTY MEDICATION ADHERENCE     Medication Adherence    Patient reported X missed doses in the last month: 0  Specialty Medication: risankizumab-rzaa (SKYRIZI) 150 mg/mL Syrg  Patient is on additional specialty medications: No  Informant: patient              Were doses missed due to medication being on hold? No    risankizumab-rzaa (SKYRIZI) 150 mg/mL Syrg : 0 doses of medicine on hand       REFERRAL TO PHARMACIST     Referral to the pharmacist: Not needed      [facility]     Shipping address confirmed in Epic.       Delivery Scheduled: Yes, Expected medication delivery date: 11/19.     Medication will be delivered via Same Day Courier to the prescription address in Epic [facility].    [facility]

## 2023-09-08 MED FILL — SKYRIZI 150 MG/ML SUBCUTANEOUS SYRINGE: 84 days supply | Qty: 1 | Fill #1

## 2023-11-16 ENCOUNTER — Encounter: Payer: 59 | Admitting: Family Medicine

## 2023-11-16 ENCOUNTER — Telehealth: Payer: Self-pay | Admitting: Family Medicine

## 2023-11-16 ENCOUNTER — Encounter: Payer: Self-pay | Admitting: Family Medicine

## 2023-11-16 NOTE — Telephone Encounter (Signed)
I left voicemail for patient giving her the phone number for our Medical Records department.  I let her know that there may be a fee involved.  I let her know that she may also complete a form at her new provider's office, and they will be able to request her medical records.

## 2023-11-16 NOTE — Telephone Encounter (Signed)
Left message to call and reschedule appointment, provider sick. Please schedule next available appointment.

## 2023-11-16 NOTE — Telephone Encounter (Signed)
Copied from CRM [id number]. Topic: Medical Record Request - Records Request >> Nov 16, 2023  7:38 AM Jennifer Mcintosh wrote: Reason for CRM: Patient called in to request medical records and let staff know she will no longer be a patient at Dr. Claris Che office

## 2023-11-17 NOTE — Unmapped (Signed)
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   We reviewed proper use and side effects of topical steroids including striae and cutaneous atrophy     (2) High Risk Medication Use     Last negative quant gold 07/2022. Reordered today.    (3) Seborrheic Keratoses:    I counseled the patient regarding the following:    Seborrheic keratoses are benign and no treatment is necessary. Lesions can be warty, smooth, flat or raised. Patients can get more of these lesions as they age.   If lesions change and become enlarged, tender, burn or change colors, pt was instructed to contact us for further evaluation.     (4) Benign appearing lentigines and actinic skin damage:    Pt was reassured.  No concerning lesions warranting biopsy  The importance of sun protection and using OTC broad spectrum, SPF 30 or higher sunscreen was reviewed.     The patient was advised to call for an appointment should any new, changing, or symptomatic lesion develop.     RTC: Return in about 1 year (around 11/18/2024) for FBSE and follow up of psoriasis. or sooner as needed   _____________________________________________________________________________________      Chief Complaint     Chief Complaint   Patient presents with    Skin Check     Pt coming in for FBSE.  No new areas of concern.        HPI     Denise Richards. Denise Richards is a pleasant 63 y.o. female, who presents as a returning patient (last seen by Dr. Archie Balboa on 11/06/2022) to Dermatology for follow up of plaque psoriasis. At last visit, patient was to continue skyrizi 150 mg injections every 12 weeks and triamcinolone 0.1% ointment for plaque psoriasis.    Rash (psoriasis)  Location: N/A  Duration: N/A  Character: not flaring today  Treatment(s): skyrizi  Aggravating factor(s): N/A  Alleviating factor(s): N/A    No other concerns at this time.     Pertinent Past Medical History     No history of skin cancer     Psoriasis treatment hx:  Pt started taking methotrexate 7.5mg  weekly 10/16 - Cumulative dose ~ 330 mg -discontinued 12/17  Started Otezlea 12/17 - until 11/18 (inadequate response)  Started NBUVB in [place] 11/18 - discontinued Feb 2019  Started Humira 2/19 - 6/20  Started Cosentyx 6/20-2/21  Started Skyrizi 2/21     Patient does have Hep C antibody positivity with undetectable viral load (cleared infxn or false pos) - checked 10/18.      Family History:   Negative for melanoma.    Past Medical History, Family History, Social History, Med List, Allergies, Problem List reviewed in the rooming section of Epic     ROS: Other than symptoms mentioned in the HPI, no fevers, chills, or other skin complaints.    Physical Examination     General: Well-appearing female, in no acute distress, resting comfortably  Neuro: Alert and oriented, answers questions appropriately  Full Skin Exam: Examination of the scalp, face, eyelids, lips, nose, ears, neck, chest, abdomen, back, arms, legs, hands, feet, palms, soles, nails, buttocks was performed.  Skin examination was  notable for the following:  - Scattered tan macules, rhytids and speckled pigment change c/w previous sun exposure   - Several scattered stuck-on tan macules and papules over trunk and extremities   - Speckled hyperpigmented macules and patches on forehead and cheeks   - No active psoriatic plaques today    All areas not commented on are within normal limits.    Scribe's Attestation: Arvilla Market, MD, PhD obtained and performed the history, physical exam and medical decision making elements that were entered into the chart. Signed by Cherlynn Kaiser, Scribe, on November 19, 2023 at 3:47 PM.    November 19, 2023 7:53 PM. Documentation assistance provided by the Scribe. I was present during the time the encounter was recorded. The information recorded by the Scribe was done at my direction and has been reviewed and validated by me.

## 2023-11-19 ENCOUNTER — Ambulatory Visit: Admit: 2023-11-19 | Discharge: 2023-11-20 | Payer: BLUE CROSS/BLUE SHIELD

## 2023-11-19 NOTE — Unmapped (Addendum)
For your psoriasis:    - Continue skyrizi injections every 12 weeks    - Apply triamcinolone ointment twice a day to affected areas until skin is smooth.   - Okay to use for up to 2 weeks a time, then take a break for at least 1 week and restart.       Please have your labs done at [facility]    [address] [facility], [place], [place], [place] [address]        Meet your team:     Your intake nurse is: Verne Carrow    Please remember to fill out the survey you will receive after your visit. Your comments help [place] continue to improve our care.      Thanks in advance!      [facility] Dermatology Clinical Staff       Skin Cancer Prevention: Care Instructions  Your Care Instructions     Skin cancer is the abnormal growth of cells in the skin. It usually appears as a growth that changes in color, shape, or size. This can be a sore that does not heal or a change in a wart or a mole. Skin cancer is almost always curable when found early and treated. So it is important to see your doctor if you have any of these changes in your skin.  Skin cancer is the most common type of cancer. It often appears on areas of the body that have been exposed to the sun, such as the head, face, neck, back, chest, or shoulders.  Follow-up care is a key part of your treatment and safety. Be sure to make and go to all appointments, and call your doctor if you are having problems. It's also a good idea to know your test results and keep a list of the medicines you take.  How can you care for yourself at home?  Wear a wide-brimmed hat and long sleeves and pants if you are going to be outdoors for a long time.  Avoid the sun between 10 a.m. and 4 p.m., which is the peak time for UV rays.  Wear sunscreen on exposed skin. Make sure to use a broad-spectrum sunscreen that has a sun protection factor (SPF) of 30 or higher. Use it every day, even when it is cloudy.  Do not use tanning booths or sunlamps.  Use lip balm or cream that has sun protection factor (SPF) to protect your lips from getting sunburned.  Wear sunglasses that block UV rays.  When should you call for help?   Call your doctor now or seek immediate medical care if:    You have signs of infection, such as:  Increased pain, swelling, warmth, or redness.  Red streaks leading from the area.  Pus draining from the area.  A fever.   Watch closely for changes in your health, and be sure to contact your doctor if:    You see a change in your skin, such as a growth or mole that:  Grows bigger. This may happen very slowly.  Changes color.  Changes shape.  Starts to bleed easily.     You have swollen glands in your armpits, groin, or neck.     You do not get better as expected.   Where can you learn more?  Go to [facility] at https://myuncchart.org  Select Patient Education under [employer]. Enter P392 in the search box to learn more about Skin Cancer  Prevention: Care Instructions.  Current as of: October 06, 2019               Content Version: 12.8  ?? 2006-2021 Healthwise, Inc[employer].   Care instructions adapted under license by [facility]. If you have questions about a medical condition or this instruction, always ask your healthcare professional. Healthwise, Incorporated disclaims any warranty or liability for your use of this information.

## 2023-11-26 NOTE — Unmapped (Signed)
**Denise Denise** Denise Denise    Specialty Medication(s) to be Shipped:   Inflammatory Disorders: Skyrizi    Other medication(s) to be shipped: No additional medications requested for fill at this time     Denise Denise, DOB: Jul 05, 1961  Phone: [phone] (work)      Denise above HIPAA information was verified with patient.     Was a Nurse, learning disability used for this call? No    Completed refill call assessment today to schedule patient's medication shipment from the [facility]  [phone]).  Denise Denise.     Specialty medication(s) and dose(s) confirmed: Regimen is correct and unchanged.   Changes to medications: Denise Richards reports no changes at this time.  Changes to insurance: No  New side effects reported not previously addressed with a pharmacist or physician: None reported  Questions for the pharmacist: No    Confirmed patient received a Conservation officer, historic buildings and a Surveyor, mining with first shipment. The patient will receive a drug information handout for each medication shipped and additional FDA Medication Guides as required.       DISEASE/MEDICATION-SPECIFIC INFORMATION        For patients on injectable medications: Patient currently has 0 doses left.  Next injection is scheduled for 3/2. Patient states she took last injection on 12/8    SPECIALTY MEDICATION ADHERENCE     Medication Adherence    Patient reported X missed doses in the last month: 0  Specialty Medication: risankizumab-rzaa (SKYRIZI) 150 mg/mL Syrg  Patient is on additional specialty medications: No  Informant: patient              Were doses missed due to medication being on hold? No    risankizumab-rzaa (SKYRIZI) 150 mg/mL Syrg : 0 doses of medicine on hand       REFERRAL TO PHARMACIST     Referral to the pharmacist: Not needed      [facility]     Shipping address confirmed in Epic.       Delivery Scheduled: Yes, Expected medication delivery date: 2/24. Medication will be delivered via Same Day Courier to the prescription address in Epic WAM.    [facility]

## 2023-12-14 MED FILL — SKYRIZI 150 MG/ML SUBCUTANEOUS SYRINGE: 84 days supply | Qty: 1 | Fill #2

## 2024-02-23 ENCOUNTER — Ambulatory Visit
Admit: 2024-02-23 | Discharge: 2024-02-24 | Payer: BLUE CROSS/BLUE SHIELD | Attending: Student in an Organized Health Care Education/Training Program | Primary: Student in an Organized Health Care Education/Training Program

## 2024-02-23 DIAGNOSIS — Z13220 Encounter for screening for lipoid disorders: Principal | ICD-10-CM

## 2024-02-23 DIAGNOSIS — Z1231 Encounter for screening mammogram for malignant neoplasm of breast: Principal | ICD-10-CM

## 2024-02-23 DIAGNOSIS — Z131 Encounter for screening for diabetes mellitus: Principal | ICD-10-CM

## 2024-02-23 DIAGNOSIS — L409 Psoriasis, unspecified: Principal | ICD-10-CM

## 2024-02-23 DIAGNOSIS — Z1211 Encounter for screening for malignant neoplasm of colon: Principal | ICD-10-CM

## 2024-02-23 DIAGNOSIS — F32A Depression, unspecified depression type: Principal | ICD-10-CM

## 2024-02-23 DIAGNOSIS — Z23 Encounter for immunization: Principal | ICD-10-CM

## 2024-02-23 LAB — LIPID PANEL
CHOLESTEROL: 200 mg/dL — ABNORMAL HIGH (ref <200)
HDL CHOLESTEROL: 102 mg/dL (ref >50)
LDL CHOLESTEROL CALCULATED: 87 mg/dL (ref <100)
NON-HDL CHOLESTEROL: 98 mg/dL (ref <130)
TRIGLYCERIDES: 70 mg/dL (ref <150)

## 2024-02-23 LAB — HEMOGLOBIN A1C
ESTIMATED AVERAGE GLUCOSE: 103 mg/dL
HEMOGLOBIN A1C: 5.2 % (ref 4.8–5.6)

## 2024-02-23 NOTE — Unmapped (Signed)
 New Patient Clinic Note           Assessment/Plan:   Denise Richards is a 63 y.o.female    Problem List Items Addressed This Visit          Musculoskeletal and Integument    Psoriasis - Primary     Other Visit Diagnoses         Depression, unspecified depression type          Need for vaccination          Encounter for screening colonoscopy          Lipid screening          Encounter for screening mammogram for malignant neoplasm of breast          Diabetes mellitus screening                Assessment & Plan  Annual physical - HCM  - Order cholesterol and diabetes screening.  - Order colonoscopy.  Last in 2014 unremarkable, recommended 10 year follow up.   - Order mammogram.   - Administer pneumonia vaccine.  - Discussed healthy lifestyle changes, including healthy diet (increased fruits, vegetables, whole grains, reduced fast foods/processed foods, and reducing sugary foods/drinks) and regular exercise.    - Last Pap smear Jan 2024, normal, HPV neg.  No additional paps needed.  Will be >68 yo at time when she is due again in Jan 2029.      Depression  Depression is well-controlled with Celexa 10 mg daily. Discussed potential impact of SSRI on libido.  - Continue celexa 10mg  daily     Vaginal dryness  Discussed over-the-counter vaginal lubricants as first-line treatment and estradiol cream as a secondary option if lubricants are ineffective, considering associated risks of estrogen use.  - Recommend trial of over-the-counter vaginal lubricants.    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  - Continue Valtrex as needed for oral herpes outbreaks. Does not need refill at this time    Plaque psoriasis  Follows with Derm. Doing well with Skyrizi and has not needed triamcinolone.   - Continue Skyrizi 150 mg every 12 weeks        PHQ-2 Score:  PHQ-2 Total Score : 0  PHQ-9 Score:       Screening complete, depression identified / today's follow-up action documented in note. Reviewed by provider on 02/23/2024 at 7:56 AM.     Subjective   Denise Richards is a 63 y.o. female  coming to clinic today for the following issues:    Chief Complaint   Patient presents with    Establish Care     No concerns      History of Present Illness  Denise Richards is a 63 year old female who presents for establishment of care and annual physical exam.    She is seeking a primary care physician in [place] who accepts her insurance, having previously been unable to find one.    She has a history of psoriasis, which was previously severe but is now well-controlled with Skyrizi injections every three months. She follows up with Dr. Fletcher Humble in [facility] for dermatology care. Denise Richards has cleared her psoriasis completely, and she no longer uses triamcinolone ointment.    She is currently taking Celexa 10 mg daily for mood, which is effective.     She occasionally uses Valtrex for oral cold sores, which is effective in preventing outbreaks when taken at the onset  of symptoms. She does not have genital herpes.    Her past medical history includes a C-section during the birth of her first son.     Family history is significant for her mother having early-onset Alzheimer's disease in her late fifties and her father having a massive heart attack in his late fifties but living to 19. Her sister had asthma as a child, and her brother, who is overweight, has had knee replacements due to a football injury. Her maternal grandmother also had Alzheimer's    She consumes alcohol, approximately seven drinks per week, and has a history of minimal tobacco use during college. She denies any drug use. She is physically active, walking three miles five times a week, and aims to increase this to daily walks. She reports eating what she wants, attributing her diet flexibility to her regular exercise.    She has had a colonoscopy in the past, with the last one being in 2014, which showed hyperplastic polyps.    ROS: The balance of the remaining 10 systems reviewed are negative.    I have reviewed the problem list, medications, and allergies and have updated/reconciled them if needed.    HISTORY:    Past Medical History:   Diagnosis Date    Psoriasis      No Known Allergies  Social History     Tobacco Use    Smoking status: Never     Passive exposure: Never    Smokeless tobacco: Never   Vaping Use    Vaping status: Never Used   Substance Use Topics    Alcohol use: Yes     Alcohol/week: 6.0 standard drinks of alcohol     Types: 2 Glasses of wine, 2 Cans of beer, 2 Shots of liquor per week     Comment: no more than 2 drinks per night    Drug use: Never     Family History   Problem Relation Age of Onset    Alzheimer's disease Mother 15 - 35    Heart disease Father 4 - 53    Stroke Father         He was older, small mini-strokes.    No Known Problems Sister     Arthritis Brother     No Known Problems Son     No Known Problems Son     Alzheimer's disease Maternal Grandmother     Heart disease Maternal Grandmother     Heart disease Maternal Grandfather     Stroke Paternal Grandmother     Heart disease Paternal Grandfather     Melanoma Neg Hx     Basal cell carcinoma Neg Hx     Squamous cell carcinoma Neg Hx        Health Maintenance   Topic Date Due    Pneumococcal Vaccine 50+ (2 of 2 - PPSV23) 11/20/2021    Lipid Screening  12/16/2022    Colon Cancer Screening  03/05/2023    COVID-19 Vaccine (7 - 2024-25 season) 06/21/2023    Mammogram  02/07/2024    Pap Smear (21-65)  11/11/2025    DTaP/Tdap/Td Vaccines (2 - Td or Tdap) 11/27/2026    Hepatitis C Screen  Completed    Influenza Vaccine  Completed    Zoster Vaccines  Completed       ROS: A 10 point review of systems was performed and is otherwise negative except as mentioned in the HPI.     Objective     Vitals: BP  118/70  - Pulse 67  - Temp 36.2 ??C (97.2 ??F) (Temporal)  - Resp 16  - Ht 173.5 cm (5' 8.31)  - Wt 71.6 kg (157 lb 12.8 oz)  - SpO2 98%  - BMI 23.78 kg/m??     Physical Exam  GENERAL: Well-developed, well-appearing, NAD.  CHEST: Clear to auscultation bilaterally, no wheezes, rhonchi, or crackles.  CARDIOVASCULAR: Normal heart rate and rhythm, S1 and S2 normal without murmurs.  ABDOMEN: Soft, non-tender, non-distended, without organomegaly, normal bowel sounds.  SKIN: No rash, no acute lesions  EXTREM: No LE edema.   NEURO: No focal deficits  PSYCH: Affect appropriate. Speech linear, goal-directed.     I have reviewed pertinent recent labs and imaging in Epic    Lynder Sanger, MD

## 2024-03-02 ENCOUNTER — Other Ambulatory Visit: Payer: Self-pay | Admitting: Family Medicine

## 2024-03-02 DIAGNOSIS — F32A Depression, unspecified: Secondary | ICD-10-CM

## 2024-03-08 NOTE — Unmapped (Signed)
 [facility] has made a second and final attempt to reach this patient to refill the following medication:SKYRIZI 150 mg/mL Syrg (risankizumab-rzaa).      We have left voicemails on the following phone numbers: [phone], have been unable to leave messages on the following phone numbers: [phone], have sent a MyChart message, and have sent a text message to the following phone numbers: [phone].    Dates contacted: 03/02/2024 and 03/08/2024  Last scheduled delivery: 12/14/2023    The patient may be at risk of non-compliance with this medication. The patient should call the [facility] at [phone]  Option 4, then Option 2: Dermatology, Gastroenterology, Rheumatology to refill medication.    Lanny Plan   [facility] and Home Delivery Oncologist

## 2024-03-10 ENCOUNTER — Ambulatory Visit: Admit: 2024-03-10 | Discharge: 2024-03-11 | Payer: BLUE CROSS/BLUE SHIELD | Attending: Surgery | Primary: Surgery

## 2024-03-10 DIAGNOSIS — Z1211 Encounter for screening for malignant neoplasm of colon: Principal | ICD-10-CM

## 2024-03-10 MED ORDER — BISACODYL 5 MG TABLET,DELAYED RELEASE
ORAL_TABLET | RECTAL | 0 refills | 0.00000 days | Status: CP
Start: 2024-03-10 — End: ?

## 2024-03-10 MED ORDER — PEG 3350-ELECTROLYTES 236 GRAM-22.74 GRAM-6.74 GRAM-5.86 GRAM SOLUTION
Freq: Once | ORAL | 0 refills | 1.00000 days | Status: CP
Start: 2024-03-10 — End: 2024-03-10

## 2024-03-10 NOTE — Unmapped (Signed)
 The patient reports they are currently: at home. I spent 8 minutes on the phone with the patient on the date of service. I spent an additional 9 minutes on pre- and post-visit activities on the date of service.      The patient was physically located in [place]  or a state in which I am permitted to provide care. The patient and/or parent/guardian understood that s/he may incur co-pays and cost sharing, and agreed to the telemedicine visit. The visit was reasonable and appropriate under the circumstances given the patient's presentation at the time.     The patient and/or parent/guardian has been advised of the potential risks and limitations of this mode of treatment (including, but not limited to, the absence of in-person examination) and has agreed to be treated using telemedicine. The patient's/patient's family's questions regarding telemedicine have been answered.      If the visit was completed in an ambulatory setting, the patient and/or parent/guardian has also been advised to contact their provider???s office for worsening conditions, and seek emergency medical treatment and/or call 911 if the patient deems either necessary.    Name: Denise Richards  DOB: [birth year]  MRN: [id number]  Date: 03/10/2024    General Surgery Office Consult Note    Primary Care Provider:  Allyn Arenas, MD   Provider Requesting Consultation: Allyn Arenas*   Provider Performing Consultation: Mervin Abu, MD    Reason for Consultation: Screening for colon cancer.    History of Present Illness: Denise Richards is a 63 y.o.  female, who is being seen at the request of  Dr. Annabell Key because of colon cancer screening.  Prior screening: in 2014. Normal. Though she said she might have had some polyps she can't remember. No records.  FH: Negative for colon cancer.  Risk factors: none.  Pertinent positives: None.  Pertinent negatives: No abdominal pain, nausea, vomiting, change in bowel habits, rectal bleeding, melana, and weight loss.  PSH: CS x 1.    Review of Systems:     General - No fever, chills or recent weight changes   CVS - No chest pain, palpitations, dyspnea on exertion or orthopnea  Respiratory - No cough, wheezing or shortness of breath   Gastrointestinal - see HPI  Hematological - No easy bruising or easy bleeding    PAST MEDICAL HISTORY:  Past Medical History:   Diagnosis Date    Psoriasis        SURGICAL HISTORY:  Past Surgical History:   Procedure Laterality Date    CESAREAN SECTION  12/25/1991       Social History:      Social History     Tobacco Use    Smoking status: Never     Passive exposure: Never    Smokeless tobacco: Never   Vaping Use    Vaping status: Never Used   Substance Use Topics    Alcohol use: Yes     Alcohol/week: 6.0 standard drinks of alcohol     Types: 2 Glasses of wine, 2 Cans of beer, 2 Shots of liquor per week     Comment: no more than 2 drinks per night    Drug use: Never       Family History:   Family History   Problem Relation Age of Onset    Alzheimer's disease Mother 72 - 39    Heart disease Father 60 - 10    Stroke Father  He was older, small mini-strokes.    No Known Problems Sister     Arthritis Brother     No Known Problems Son     No Known Problems Son     Alzheimer's disease Maternal Grandmother     Heart disease Maternal Grandmother     Heart disease Maternal Grandfather     Stroke Paternal Grandmother     Heart disease Paternal Grandfather     Melanoma Neg Hx     Basal cell carcinoma Neg Hx     Squamous cell carcinoma Neg Hx     Cancer Neg Hx        Medications:  Current Outpatient Medications   Medication Sig Dispense Refill    citalopram (CELEXA) 10 MG tablet       ferrous sulfate 325 (65 FE) MG EC tablet Take 1 tablet (325 mg total) by mouth Three (3) times a day with a meal.      multivitamin with minerals tablet Take 1 tablet by mouth daily.      risankizumab-rzaa (SKYRIZI) 150 mg/mL Syrg Inject the contents of 1 syringe (150 mg total) under the skin every 12 weeks. 1 mL 3    valACYclovir (VALTREX) 500 MG tablet       bisacodyl (DULCOLAX, BISACODYL,) 5 mg EC tablet Take 4 tablets all together at one time the day before your colonoscopy 4 tablet 0    polyethylene glycol (GOLYTELY) 236-22.74-6.74 gram solution Take 4,000 mL by mouth once for 1 dose. 4000 mL 0     No current facility-administered medications for this visit.       Allergies:   No Known Allergies     Physical Exam:   There were no vitals taken for this visit.  No physical exam performed this was a phone visit    Labs and Diagnostic Studies:  Lab Results   Component Value Date    WBC 4.0 (L) 08/20/2017    HGB 13.9 08/20/2017    HCT 42.3 08/20/2017    PLT 258 08/20/2017     Lab Results   Component Value Date    BUN 12 08/20/2017    CREATININE 0.94 08/20/2017       Lab Results   Component Value Date    ALT 30 08/20/2017    AST 23 08/20/2017     No results found for: PT, INR, PTT    Assessment, Plan and Recommendations:   Denise Richards is a 63 y.o.  female, who is here for colon cancer screening.  Patient  is due for screening colonoscopy.  The procedure risks and benefits were discussed today. The discussed risks include but are not limited to the risk of sedation (MAC), bleeding, perforation, the small likelihood of an incomplete exam requiring another study like a virtual colonoscopy and the small likelihood of missing a small lesion behind a fold or in case of sub optimal prep. The patient also understands that, if found, all polyps will be biopsied and removed completely if possible. Patient understands and wishes to proceed. Informed consent was obtained. All questions were answered. Prep instructions were given.  Procedure was scheduled for 04/19/24  Prep sent    Audrea Blender, PA-C    03/10/2024  1:32 PM    CC: Dr. Annabell Key

## 2024-03-10 NOTE — Unmapped (Signed)
 Colonoscopy prep instructions, low residual diet, and AVS mailed to patient.

## 2024-04-07 NOTE — Unmapped (Signed)
 [facility] Clinical Assessment & Refill Coordination Note    Patient reports to be doing well on the medication and despite being past due for injection reports no recurrence of psoriasis and did not have any questions or concerns at this time    Denise Richards, DOB: November 05, 1960  Phone: [phone] (work)    All above HIPAA information was verified with patient.     Was a Nurse, learning disability used for this call? No    Specialty Medication(s):   Inflammatory Disorders: Skyrizi     Current Medications[1]     Changes to medications: Denise Richards reports no changes at this time.    Medication list has been reviewed and updated in Epic: Yes    Allergies[2]    Changes to allergies: No    Allergies have been reviewed and updated in Epic: Yes    SPECIALTY MEDICATION ADHERENCE     Skyziri 150 mg/ml: 0 doses of medicine on hand   Medication Adherence    Patient reported X missed doses in the last month: 1  Specialty Medication: Skyrizi 150mg /mL -1q12w  Patient is on additional specialty medications: No  Informant: patient          Specialty medication(s) dose(s) confirmed: Regimen is correct and unchanged.     Are there any concerns with adherence? No    Adherence counseling provided? Not needed    CLINICAL MANAGEMENT AND INTERVENTION      Clinical Benefit Assessment:    Do you feel the medicine is effective or helping your condition? Yes    Clinical Benefit counseling provided? Not needed    Adverse Effects Assessment:    Are you experiencing any side effects? No    Are you experiencing difficulty administering your medicine? No    Quality of Life Assessment:    Quality of Life    Rheumatology  Oncology  Dermatology  1. What impact has your specialty medication had on the symptoms of your skin condition (i.e. itchiness, soreness, stinging)?: Tremendous  2. What impact has your specialty medication had on your comfort level with your skin?: Tremendous  Cystic Fibrosis          How many days over the past month did your psoriasis  keep you from your normal activities? For example, brushing your teeth or getting up in the morning. 0    Have you discussed this with your provider? Not needed    Acute Infection Status:    Acute infections noted within Epic:  No active infections    Patient reported infection: None    Therapy Appropriateness:    Is therapy appropriate based on current medication list, adverse reactions, adherence, clinical benefit and progress toward achieving therapeutic goals? Yes, therapy is appropriate and should be continued     Clinical Intervention:    Was an intervention completed as part of this clinical assessment? No    DISEASE/MEDICATION-SPECIFIC INFORMATION      For patients on injectable medications: Patient currently has 0 doses left.  Next injection is scheduled for Past due - will take on 6/23.    Chronic Inflammatory Diseases: Have you experienced any flares in the last month? No  Has this been reported to your provider? Not applicable    PATIENT SPECIFIC NEEDS     Does the patient have any physical, cognitive, or cultural barriers? No    Is the patient high risk? No    Does the patient require physician intervention or other additional services (i.e.,  nutrition, smoking cessation, social work)? No    Does the patient have an additional or emergency contact listed in their chart? Yes    SOCIAL DETERMINANTS OF HEALTH     At the [facility], we have learned that life circumstances - like trouble affording food, housing, utilities, or transportation can affect the health of many of our patients.   That is why we wanted to ask: are you currently experiencing any life circumstances that are negatively impacting your health and/or quality of life? No    Social Drivers of Health     Food Insecurity: No Food Insecurity (02/23/2024)    Hunger Vital Sign     Worried About Running Out of Food in the Last Year: Never true     Ran Out of Food in the Last Year: Never true   Tobacco Use: Low Risk  (03/10/2024) Patient History     Smoking Tobacco Use: Never     Smokeless Tobacco Use: Never     Passive Exposure: Never   Transportation Needs: No Transportation Needs (02/23/2024)    PRAPARE - Transportation     Lack of Transportation (Medical): No     Lack of Transportation (Non-Medical): No   Alcohol Use: Not on file   Housing: Low Risk  (02/23/2024)    Housing     Within the past 12 months, have you ever stayed: outside, in a car, in a tent, in an overnight shelter, or temporarily in someone else's home (i.e. couch-surfing)?: No     Are you worried about losing your housing?: No   Physical Activity: Sufficiently Active (05/26/2023)    Received from [facility]    Exercise Vital Sign     Days of Exercise per Week: 5 days     Minutes of Exercise per Session: 40 min   Utilities: Low Risk  (02/23/2024)    Utilities     Within the past 12 months, have you been unable to get utilities (heat, electricity) when it was really needed?: No   Stress: Stress Concern Present (05/26/2023)    Received from [facility] - Occupational Stress Questionnaire     Feeling of Stress : Rather much   Interpersonal Safety: Not At Risk (02/23/2024)    Interpersonal Safety     Unsafe Where You Currently Live: No     Physically Hurt by Anyone: No     Abused by Anyone: No   Substance Use: Not on file (08/25/2023)   Intimate Partner Violence: Not At Risk (02/23/2024)    Humiliation, Afraid, Rape, and Kick questionnaire     Fear of Current or Ex-Partner: No     Emotionally Abused: No     Physically Abused: No     Sexually Abused: No   Social Connections: Socially Integrated (05/26/2023)    Received from [facility]    Social Connection and Isolation Panel     Frequency of Communication with Friends and Family: Three times a week     Frequency of Social Gatherings with Friends and Family: Once a week     Attends Religious Services: More than 4 times per year     Active Member of [employer] or Organizations: Yes     Attends Banker Meetings: More than 4 times per year     Marital Status: Married   Physicist, medical Strain: Low Risk  (05/26/2023)    Received from [facility]    Overall Financial  Resource Strain (CARDIA)     Difficulty of Paying Living Expenses: Not hard at all   Health Literacy: Low Risk  (07/24/2021)    Health Literacy     : Never   Internet Connectivity: Not on file       Would you be willing to receive help with any of the needs that you have identified today? Not applicable       SHIPPING     Specialty Medication(s) to be Shipped:   Inflammatory Disorders: Skyrizi    Other medication(s) to be shipped: No additional medications requested for fill at this time     Changes to insurance: No    Cost and Payment: Patient has a $0 copay, payment information is not required.    Delivery Scheduled: Yes, Expected medication delivery date: 6/23.     Medication will be delivered via Same Day Courier to the confirmed prescription address in [facility].    The patient will receive a drug information handout for each medication shipped and additional FDA Medication Guides as required.  Verified that patient has previously received a Conservation officer, historic buildings and a Surveyor, mining.    The patient or caregiver noted above participated in the development of this care plan and knows that they can request review of or adjustments to the care plan at any time.      All of the patient's questions and concerns have been addressed.    Harlene DELENA Elder, PharmD   [facility] Specialty Pharmacist       [1]   Current Outpatient Medications   Medication Sig Dispense Refill    bisacodyl (DULCOLAX, BISACODYL,) 5 mg EC tablet Take 4 tablets all together at one time the day before your colonoscopy 4 tablet 0    citalopram (CELEXA) 10 MG tablet       ferrous sulfate 325 (65 FE) MG EC tablet Take 1 tablet (325 mg total) by mouth Three (3) times a day with a meal.      multivitamin with minerals tablet Take 1 tablet by mouth daily.      risankizumab-rzaa (SKYRIZI) 150 mg/mL Syrg Inject the contents of 1 syringe (150 mg total) under the skin every 12 weeks. 1 mL 3    valACYclovir (VALTREX) 500 MG tablet        No current facility-administered medications for this visit.   [2] No Known Allergies

## 2024-04-11 MED FILL — SKYRIZI 150 MG/ML SUBCUTANEOUS SYRINGE: SUBCUTANEOUS | 84 days supply | Qty: 1 | Fill #3

## 2024-04-19 ENCOUNTER — Encounter
Admit: 2024-04-19 | Discharge: 2024-04-19 | Payer: BLUE CROSS/BLUE SHIELD | Attending: Anesthesiology | Primary: Anesthesiology

## 2024-04-19 ENCOUNTER — Inpatient Hospital Stay: Admit: 2024-04-19 | Discharge: 2024-04-19 | Payer: BLUE CROSS/BLUE SHIELD

## 2024-04-19 MED ADMIN — Propofol (DIPRIVAN) injection: INTRAVENOUS | @ 19:00:00 | Stop: 2024-04-19

## 2024-04-19 MED ADMIN — sodium chloride (NS) 0.9 % infusion: INTRAVENOUS | @ 18:00:00 | Stop: 2024-04-19

## 2024-04-19 MED ADMIN — Propofol (DIPRIVAN) injection: INTRAVENOUS | @ 18:00:00 | Stop: 2024-04-19

## 2024-04-19 NOTE — Unmapped (Signed)
 Name: Denise Richards  DOB: Jan 28, 1961  MRN: [id number]  Admit Date: 04/19/2024    GENERAL SURGERY HISTORY AND PHYSICAL    Primary Care Provider:  Cleotilde Comer Pyo, MD     Chief Complaint: No chief complaint on file.      History of Present Illness: Denise Richards is a 63 y.o.  female, here for screening colonoscopy. She is average risk. She completed the prep. She did not follow the clear liquid diet and ate a tortilla with cheese yesterday morning. She still feels her prep will be adequate.    Review of Systems:     General - No fever, chills or recent weight changes   Neurological - No headaches or neck pain  HEENT - No visual or hearing changes. No sore throat   CVS - No chest pain, palpitations, dyspnea on exertion or orthopnea  Respiratory - No cough, wheezing or shortness of breath   Gastrointestinal - No abdominal pain, nausea, vomiting or change in bowel habits  Endocrine - No heat or cold intolerance  Genitourinary - No dysuria, frequency, urgency or changes in urine output   Skin - No rash   Musculoskeletal - No joint or back pain  Hematological - No easy bruising or easy bleeding    Past Medical History:  Past Medical History[1]    Surgical History:  Past Surgical History[2]    Social History:      Social History     Tobacco Use    Smoking status: Never     Passive exposure: Never    Smokeless tobacco: Never   Substance Use Topics    Alcohol use: Yes     Alcohol/week: 6.0 standard drinks of alcohol     Types: 2 Glasses of wine, 2 Cans of beer, 2 Shots of liquor per week     Comment: no more than 2 drinks per night         Family History:.  Family History[3]    Medications:  Current Medications[4]    Allergies:    Allergies as of 03/10/2024    (No Known Allergies)       Physical Exam:  Patient Vitals for the past 6 hrs:   BP Temp Temp src Pulse SpO2 Pulse Resp SpO2 Height Weight   04/19/24 1305 150/83 37.2 ??C (99 ??F) Temporal 82 83 15 100 % 175.3 cm (5' 9) 70.3 kg (155 lb)         General: No Acute distress, patient awake, alert, oriented x 3.  HEENT: Normocephalic.  Neck: neck is supple.  Cardiovascular System: regular rate and rhythm.   Lungs: Good air entry bilaterally.    Abdomen: Soft, non-tender, non distended.   Extremities: no peripheral extremity edema, No cyanosis or clubbing.   Neurologic: Cranial nerves intact, no focal neurological deficits   Psychiatric: no obvious signs of anxiety or depression  Skin: no rash noted.     Results:  Patient's medical records have been reviewed including Labs and Radiology.    Lab Results   Component Value Date    WBC 4.0 (L) 08/20/2017    HGB 13.9 08/20/2017    HCT 42.3 08/20/2017    PLT 258 08/20/2017     Lab Results   Component Value Date    BUN 12 08/20/2017    CREATININE 0.94 08/20/2017       Lab Results   Component Value Date    ALT 30 08/20/2017    AST 23 08/20/2017  No results found for: AMYLASE  No results found for: LIPASE  No results found for: PT, INR, PTT    Assessment and Plan:    Denise Richards is a 63 y.o.  female, here for screening colonoscopy. We will proceed as scheduled.    Garnette Minder, PA-C    04/19/2024  2:04 PM         [1]   Past Medical History:  Diagnosis Date    Psoriasis    [2]   Past Surgical History:  Procedure Laterality Date    CESAREAN SECTION  12/25/1991   [3]   Family History  Problem Relation Age of Onset    Alzheimer's disease Mother 54 - 27    Heart disease Father 43 - 8    Stroke Father         He was older, small mini-strokes.    No Known Problems Sister     Arthritis Brother     No Known Problems Son     No Known Problems Son     Alzheimer's disease Maternal Grandmother     Heart disease Maternal Grandmother     Heart disease Maternal Grandfather     Stroke Paternal Grandmother     Heart disease Paternal Grandfather     Melanoma Neg Hx     Basal cell carcinoma Neg Hx     Squamous cell carcinoma Neg Hx     Cancer Neg Hx    [4]   No current facility-administered medications for this encounter.

## 2024-04-19 NOTE — Unmapped (Signed)
 Name: Denise Richards  DOB: 06-May-1961  MRN: [id number]    Colonoscopy    Date of Procedure: 04/19/2024.    Surgeon:  RENELLA RUDDY, MD    Assistant: none.    Preoperative Diagnosis / Indication:  screening for colon cancer    Postoperative Diagnosis: polyp(s).    Procedure: Colonoscopy with polypectomy (cold snare), polypectomy (cold biopsy)    Sedation: MAC    Instrument: Refer to nursing notes    EBL:  None    Complications: None    Specimen(s): polyps    Description of Procedure: Patient was identified in pre-op holding area and was taken back to the endoscopy suite. Appropriate timeout identification was performed. MAC anesthesia was then induced after the patient was placed in the left lateral decubitus position. Surgical lubricant was applied to the anal area.  A rectal examination was performed and was normal.  The endoscope was then inserted into the rectum and advanced under direct visualization into the cecum which was confirmed by visualization of the IC valve and appendiceal orifice. . The mucosa prep was good.  Time to cecum was 8 minutes.  External pressure was applied on occasion to assist with loop reduction  On withdrawing the endoscope, the lumen was carefully examined.  4 mm semipedunculated polyp at the splenic flexure was removed using cold snare and retrieved.  Polypectomy was complete and the site was hemostatic.  Several diminutive, hyperplastic looking polyps were seen in the rectum.  A couple of these were removed using cold forceps. A retroflex examination in the rectum was performed and was normal. The endoscope was then withdrawn and the procedure terminated.  Withdrawal time was 10 minutes.    Findings / Comments:  Polyps removed as described above.    Summary / Recommendations:    Await pathology.  Follow up with PCP as scheduled.  Repeat colonoscopy recommended in 5 years unless pathology dictates otherwise.    Gurleen Larrivee S. RUDDY, MD     04/19/2024  2:46 PM

## 2024-04-21 NOTE — Telephone Encounter (Signed)
 I spoke with patient and let her know that her new provider should have a form for her to complete, then they will send it to us  to request her medical records.

## 2024-04-21 NOTE — Telephone Encounter (Signed)
 noted

## 2024-05-23 ENCOUNTER — Inpatient Hospital Stay: Admit: 2024-05-23 | Discharge: 2024-05-23 | Payer: BLUE CROSS/BLUE SHIELD

## 2024-05-23 DIAGNOSIS — Z1231 Encounter for screening mammogram for malignant neoplasm of breast: Principal | ICD-10-CM

## 2024-06-22 DIAGNOSIS — L409 Psoriasis, unspecified: Principal | ICD-10-CM

## 2024-06-22 MED ORDER — SKYRIZI 150 MG/ML SUBCUTANEOUS SYRINGE
3 refills | 0.00000 days
Start: 2024-06-22 — End: ?

## 2024-06-22 NOTE — Unmapped (Signed)
 [facility] Refill Coordination Note    Specialty Medication(s) to be Shipped:   Inflammatory Disorders: Skyrizi     Other medication(s) to be shipped: No additional medications requested for fill at this time    Specialty Medications not needed at this time: N/A     Charisa Twitty, DOB: [birth year]  Phone: [phone] (work)      All above HIPAA information was verified with patient.     Was a Nurse, learning disability used for this call? No    Completed refill call assessment today to schedule patient's medication shipment from the [facility]  [phone]).  All relevant notes have been reviewed.     Specialty medication(s) and dose(s) confirmed: Regimen is correct and unchanged.   Changes to medications: Juan reports no changes at this time.  Changes to insurance: No  New side effects reported not previously addressed with a pharmacist or physician: None reported  Questions for the pharmacist: No    Confirmed patient received a Conservation officer, historic buildings and a Surveyor, mining with first shipment. The patient will receive a drug information handout for each medication shipped and additional FDA Medication Guides as required.       DISEASE/MEDICATION-SPECIFIC INFORMATION        For patients on injectable medications: Next injection is scheduled for 06/11/2024.    SPECIALTY MEDICATION ADHERENCE     Medication Adherence    Patient reported X missed doses in the last month: 0  Specialty Medication: SKYRIZI  150 mg/mL Syrg (risankizumab -rzaa)  Patient is on additional specialty medications: No              Were doses missed due to medication being on hold? No     SKYRIZI  150 mg/mL Syrg (risankizumab -rzaa): 0 doses of medicine on hand       REFERRAL TO PHARMACIST     Referral to the pharmacist: Not needed      SHIPPING     Shipping address confirmed in Epic.     Cost and Payment: Patient has a $0 copay, payment information is not required.    Delivery Scheduled: Yes, Expected medication delivery date: 06/27/2024.  However, Rx request for refills was sent to the provider as there are none remaining.     Medication will be delivered via Same Day Courier to the prescription address in Epic WAM.    Lucie CHRISTELLA Forts   [facility]  Specialty Technician

## 2024-06-27 NOTE — Unmapped (Signed)
 Denise Richards 's Skyrizi  shipment will be delayed as a result of no refills remain on the prescription.      I have reached out to the patient  at [phone] and communicated the delay. We will call the patient back to reschedule the delivery upon resolution. We have not confirmed the new delivery date.

## 2024-07-03 MED ORDER — SKYRIZI 150 MG/ML SUBCUTANEOUS SYRINGE
SUBCUTANEOUS | 3 refills | 0.00000 days | Status: CP
Start: 2024-07-03 — End: ?
  Filled 2024-07-06: qty 1, 84d supply, fill #0

## 2024-07-04 DIAGNOSIS — L409 Psoriasis, unspecified: Principal | ICD-10-CM

## 2024-07-06 NOTE — Unmapped (Signed)
 Denise Richards 's SKYRIZI  150 mg/mL Syrg (risankizumab -rzaa) shipment will be rescheduled as a result of prior authorization now approved.     I have reached out to the patient  at [phone] and communicated the delivery change. We will reschedule the medication for the delivery date that the patient agreed upon.  We have confirmed the delivery date as 07/06/24

## 2024-07-21 ENCOUNTER — Other Ambulatory Visit: Payer: Self-pay | Admitting: Family Medicine

## 2024-07-21 ENCOUNTER — Telehealth: Payer: Self-pay

## 2024-07-21 DIAGNOSIS — F32A Depression, unspecified: Secondary | ICD-10-CM

## 2024-07-21 NOTE — Telephone Encounter (Signed)
 Noted

## 2024-07-21 NOTE — Telephone Encounter (Signed)
 Copied from CRM [id number]. Topic: Clinical - Medication Refill >> Jul 21, 2024  3:58 PM Suzen RAMAN wrote: Medication: citalopram  (CELEXA ) 10 MG tablet   Has the patient contacted their pharmacy? Yes   This is the patient's preferred pharmacy:  [id number] - [place], [place] - [address] AT [facility] [address] [place] [address] Phone: [phone] Fax: [phone]  Is this the correct pharmacy for this prescription? Yes If no, delete pharmacy and type the correct one.   Has the prescription been filled recently? No  Is the patient out of the medication? Yes  Has the patient been seen for an appointment in the last year OR does the patient have an upcoming appointment? Yes  Can we respond through MyChart? Yes  Agent: Please be advised that Rx refills may take up to 3 business days. We ask that you follow-up with your pharmacy.

## 2024-07-26 NOTE — Telephone Encounter (Signed)
 Error

## 2024-09-20 NOTE — Progress Notes (Signed)
 The [facility] has made a second and final attempt to reach this patient to refill the following medication:SKYRIZI  150 mg/mL Syrg (risankizumab -rzaa).      We have left voicemails on the following phone numbers: [phone] and [phone] and have sent a text message to the following phone numbers: [phone].    Dates contacted: 09/14/24-09/20/24  Last scheduled delivery: 07/06/24    The patient may be at risk of non-compliance with this medication. The patient should call the [facility] at [phone]  Option 4, then Option 2: Dermatology, Gastroenterology, Rheumatology to refill medication.    Dena LOISE [facility] Home Delivery Oncologist

## 2024-09-21 NOTE — Progress Notes (Signed)
 [facility] Refill Coordination Note    Specialty Medication(s) to be Shipped:   Inflammatory Disorders: Skyrizi     Other medication(s) to be shipped: No additional medications requested for fill at this time    Specialty Medications not needed at this time: N/A     Denise Richards, DOB: 26-Aug-1961  Phone: [phone] (work)      All above HIPAA information was verified with patient.     Was a nurse, learning disability used for this call? No    Completed refill call assessment today to schedule patient's medication shipment from the [facility]  [phone]).  All relevant notes have been reviewed.     Specialty medication(s) and dose(s) confirmed: Regimen is correct and unchanged.   Changes to medications: [name] reports no changes at this time.  Changes to insurance: No  New side effects reported not previously addressed with a pharmacist or physician: None reported  Questions for the pharmacist: No    Confirmed patient received a Conservation Officer, Historic Buildings and a Surveyor, Mining with first shipment. The patient will receive a drug information handout for each medication shipped and additional FDA Medication Guides as required.       DISEASE/MEDICATION-SPECIFIC INFORMATION        For patients on injectable medications: Next injection is scheduled for 09/28/24.    SPECIALTY MEDICATION ADHERENCE     Medication Adherence    Patient reported X missed doses in the last month: 0  Specialty Medication: risankizumab -rzaa: SKYRIZI  150 mg/mL Syrg  Patient is on additional specialty medications: No  Patient is on more than two specialty medications: No  Any gaps in refill history greater than 2 weeks in the last 3 months: no  Demonstrates understanding of importance of adherence: yes              Were doses missed due to medication being on hold? No        risankizumab -rzaa: SKYRIZI  150 mg/mL Syrg: 0 doses of medicine on hand        REFERRAL TO PHARMACIST     Referral to the pharmacist: Not needed      SHIPPING     Shipping address confirmed in Epic.     Cost and Payment: Patient has a $0 copay, payment information is not required.    Delivery Scheduled: Yes, Expected medication delivery date: 09/28/24 .     Medication will be delivered via Next Day Courier to the prescription address in Epic WAM.    [name]   [facility]  Specialty Technician

## 2024-09-27 MED FILL — SKYRIZI 150 MG/ML SUBCUTANEOUS SYRINGE: SUBCUTANEOUS | 84 days supply | Qty: 1 | Fill #1
